# Patient Record
Sex: Female | Born: 1950 | Race: White | Hispanic: No | Marital: Married | Smoking: Never smoker
Health system: Southern US, Community
[De-identification: ages and names within clinical notes are randomized; demographics above are authoritative.]

## PROBLEM LIST (undated history)

## (undated) DIAGNOSIS — E785 Hyperlipidemia, unspecified: Secondary | ICD-10-CM

## (undated) DIAGNOSIS — I491 Atrial premature depolarization: Secondary | ICD-10-CM

## (undated) DIAGNOSIS — I499 Cardiac arrhythmia, unspecified: Secondary | ICD-10-CM

## (undated) DIAGNOSIS — Z0000 Encounter for general adult medical examination without abnormal findings: Secondary | ICD-10-CM

## (undated) DIAGNOSIS — K635 Polyp of colon: Secondary | ICD-10-CM

## (undated) DIAGNOSIS — M549 Dorsalgia, unspecified: Secondary | ICD-10-CM

## (undated) DIAGNOSIS — T7840XA Allergy, unspecified, initial encounter: Secondary | ICD-10-CM

## (undated) DIAGNOSIS — R002 Palpitations: Secondary | ICD-10-CM

## (undated) DIAGNOSIS — M1990 Unspecified osteoarthritis, unspecified site: Secondary | ICD-10-CM

## (undated) DIAGNOSIS — I471 Supraventricular tachycardia: Secondary | ICD-10-CM

## (undated) HISTORY — PX: PILONIDAL CYST EXCISION: SHX744

## (undated) HISTORY — DX: Dorsalgia, unspecified: M54.9

## (undated) HISTORY — PX: APPENDECTOMY: SHX54

## (undated) HISTORY — DX: Unspecified osteoarthritis, unspecified site: M19.90

## (undated) HISTORY — DX: Atrial premature depolarization: I49.1

## (undated) HISTORY — PX: OTHER SURGICAL HISTORY: SHX169

## (undated) HISTORY — DX: Polyp of colon: K63.5

## (undated) HISTORY — DX: Cardiac arrhythmia, unspecified: I49.9

## (undated) HISTORY — DX: Supraventricular tachycardia: I47.1

## (undated) HISTORY — DX: Allergy, unspecified, initial encounter: T78.40XA

## (undated) HISTORY — PX: TONSILLECTOMY: SHX5217

## (undated) HISTORY — DX: Palpitations: R00.2

## (undated) HISTORY — DX: Hyperlipidemia, unspecified: E78.5

## (undated) HISTORY — DX: Encounter for general adult medical examination without abnormal findings: Z00.00

---

## 1998-11-13 ENCOUNTER — Other Ambulatory Visit: Admission: RE | Admit: 1998-11-13 | Discharge: 1998-11-13 | Payer: Self-pay | Admitting: Family Medicine

## 1999-11-22 ENCOUNTER — Other Ambulatory Visit: Admission: RE | Admit: 1999-11-22 | Discharge: 1999-11-22 | Payer: Self-pay | Admitting: *Deleted

## 2001-04-20 ENCOUNTER — Ambulatory Visit (HOSPITAL_COMMUNITY): Admission: RE | Admit: 2001-04-20 | Discharge: 2001-04-20 | Payer: Self-pay | Admitting: Internal Medicine

## 2001-04-20 ENCOUNTER — Encounter (INDEPENDENT_AMBULATORY_CARE_PROVIDER_SITE_OTHER): Payer: Self-pay | Admitting: Specialist

## 2002-05-24 ENCOUNTER — Other Ambulatory Visit: Admission: RE | Admit: 2002-05-24 | Discharge: 2002-05-24 | Payer: Self-pay | Admitting: Internal Medicine

## 2003-06-02 ENCOUNTER — Other Ambulatory Visit: Admission: RE | Admit: 2003-06-02 | Discharge: 2003-06-02 | Payer: Self-pay | Admitting: Obstetrics & Gynecology

## 2003-11-06 ENCOUNTER — Ambulatory Visit (HOSPITAL_COMMUNITY): Admission: RE | Admit: 2003-11-06 | Discharge: 2003-11-06 | Payer: Self-pay | Admitting: Orthopedic Surgery

## 2004-07-01 ENCOUNTER — Other Ambulatory Visit: Admission: RE | Admit: 2004-07-01 | Discharge: 2004-07-01 | Payer: Self-pay | Admitting: Obstetrics & Gynecology

## 2005-07-25 ENCOUNTER — Other Ambulatory Visit: Admission: RE | Admit: 2005-07-25 | Discharge: 2005-07-25 | Payer: Self-pay | Admitting: Obstetrics & Gynecology

## 2007-10-13 ENCOUNTER — Ambulatory Visit: Payer: Self-pay | Admitting: Internal Medicine

## 2007-10-13 ENCOUNTER — Encounter: Payer: Self-pay | Admitting: Internal Medicine

## 2007-10-13 LAB — HM MAMMOGRAPHY

## 2007-10-18 ENCOUNTER — Encounter: Payer: Self-pay | Admitting: Internal Medicine

## 2007-11-04 ENCOUNTER — Encounter: Payer: Self-pay | Admitting: Internal Medicine

## 2007-11-04 ENCOUNTER — Ambulatory Visit: Payer: Self-pay | Admitting: Internal Medicine

## 2007-11-04 LAB — HM COLONOSCOPY

## 2007-11-08 ENCOUNTER — Encounter: Payer: Self-pay | Admitting: Internal Medicine

## 2010-11-08 NOTE — Procedures (Signed)
[facility]  Patient:    Robin Doyle, Robin Doyle Visit Number: [id number] MRN: [id number]          Service Type: END Location: ENDO Attending Physician:  Mervin Hack Dictated by:   Hart Carwin, M.D. Proc. Date: 04/20/01 Admit Date:  04/20/2001   CC:         Corwin Levins, M.D. [facility]   Procedure Report  PROCEDURE:  Colonoscopy.  INDICATIONS:  This 60 year old black female is undergoing screening colonoscopy.  She has a positive family history of colon cancer in her grandmother, who was diagnosed in her 100s.  The patient had hemoccults which were negative for occult blood. On physical exam on 04/06/01, her abdominal exam was unremarkable.  She is undergoing colonoscopy for neoplastic screening.  ENDOSCOPE: Olympus ______ scope.  SEDATION: Versed 70 mg IV, Demerol 70 mg. IV.  FINDINGS: The Olympus ______ endoscope was ______ through the rectum to the sigmoid colon. ______ were normal.  Her prep was excellent. The anal canal and rectal ampulla was normal.  There were no diverticula in the sigmoid colon. At the level of 60 cm from the rectum was a tiny, diminutive polyp, which flat and measured about 5-6 mm in diameter.  It was ablated with her biopsies, but no tissue was obtained.  The splenic flexure and transverse colon were normal. The ascending colon was normal.  In the cecum on the bottom of the pouch was also a small fusiform polyp, which was ablated with her biopsies, and tissue was sent to pathology.  There was no bleeding from postpolypectomy site.  The colonoscope was then retracted.  Her colon was decompressed.  The patient tolerated the procedure well.  IMPRESSION:  Two colon polyps, status post polypectomies.  PLAN: 1. Postpolypectomy instructions. 2. Repeat colonoscopy in five years, depending on the colon histology. Dictated by:   Hart Carwin, M.D. Attending Physician:  Mervin Hack DD:  04/20/01 TD:   04/21/01 Job: [id number]

## 2010-12-09 ENCOUNTER — Encounter: Payer: Self-pay | Admitting: Internal Medicine

## 2010-12-10 ENCOUNTER — Encounter: Payer: Self-pay | Admitting: Internal Medicine

## 2010-12-10 ENCOUNTER — Other Ambulatory Visit (INDEPENDENT_AMBULATORY_CARE_PROVIDER_SITE_OTHER): Payer: BC Managed Care – PPO

## 2010-12-10 ENCOUNTER — Ambulatory Visit (INDEPENDENT_AMBULATORY_CARE_PROVIDER_SITE_OTHER): Payer: BC Managed Care – PPO | Admitting: Internal Medicine

## 2010-12-10 ENCOUNTER — Other Ambulatory Visit: Payer: Self-pay | Admitting: Internal Medicine

## 2010-12-10 VITALS — BP 120/62 | HR 63 | Temp 97.9°F | Ht 67.5 in | Wt 133.0 lb

## 2010-12-10 DIAGNOSIS — E785 Hyperlipidemia, unspecified: Secondary | ICD-10-CM

## 2010-12-10 DIAGNOSIS — Z136 Encounter for screening for cardiovascular disorders: Secondary | ICD-10-CM

## 2010-12-10 DIAGNOSIS — Z0000 Encounter for general adult medical examination without abnormal findings: Secondary | ICD-10-CM

## 2010-12-10 DIAGNOSIS — R002 Palpitations: Secondary | ICD-10-CM

## 2010-12-10 LAB — URINALYSIS
Leukocytes, UA: NEGATIVE
Specific Gravity, Urine: <=1.005 (ref 1.000–1.030)
Total Protein, Urine: NEGATIVE
pH: 6.5 (ref 5.0–8.0)

## 2010-12-10 LAB — HEPATIC FUNCTION PANEL
ALT: 16 U/L (ref 0–35)
AST: 20 U/L (ref 0–37)
Albumin: 3.8 g/dL (ref 3.5–5.2)
Total Bilirubin: 0.5 mg/dL (ref 0.3–1.2)
Total Protein: 6.2 g/dL (ref 6.0–8.3)

## 2010-12-10 LAB — CBC WITH DIFFERENTIAL/PLATELET
Eosinophils Absolute: 0.2 10*3/uL (ref 0.0–0.7)
HCT: 40.5 % (ref 36.0–46.0)
Lymphs Abs: 1.1 10*3/uL (ref 0.7–4.0)
Monocytes Absolute: 0.4 10*3/uL (ref 0.1–1.0)
Monocytes Relative: 8 % (ref 3.0–12.0)
Neutrophils Relative %: 66.8 % (ref 43.0–77.0)
Platelets: 193 10*3/uL (ref 150.0–400.0)
RDW: 12.9 % (ref 11.5–14.6)

## 2010-12-10 LAB — BASIC METABOLIC PANEL
CO2: 28 mEq/L (ref 19–32)
Calcium: 9 mg/dL (ref 8.4–10.5)
Chloride: 108 mEq/L (ref 96–112)
Glucose, Bld: 77 mg/dL (ref 70–99)
Potassium: 3.9 mEq/L (ref 3.5–5.1)

## 2010-12-10 LAB — TSH: TSH: 3.23 u[IU]/mL (ref 0.35–5.50)

## 2010-12-10 MED ORDER — ESTRADIOL-NORETHINDRONE ACET 1-0.5 MG PO TABS: 1.0000 | ORAL_TABLET | Freq: Every day | ORAL | Status: DC
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
## 2010-12-11 ENCOUNTER — Encounter: Payer: Self-pay | Admitting: Internal Medicine

## 2010-12-11 DIAGNOSIS — E785 Hyperlipidemia, unspecified: Secondary | ICD-10-CM

## 2010-12-11 DIAGNOSIS — Z0000 Encounter for general adult medical examination without abnormal findings: Secondary | ICD-10-CM | POA: Insufficient documentation

## 2010-12-11 DIAGNOSIS — R002 Palpitations: Secondary | ICD-10-CM | POA: Insufficient documentation

## 2010-12-11 DIAGNOSIS — K635 Polyp of colon: Secondary | ICD-10-CM | POA: Insufficient documentation

## 2010-12-11 HISTORY — DX: Hyperlipidemia, unspecified: E78.5

## 2010-12-11 HISTORY — DX: Encounter for general adult medical examination without abnormal findings: Z00.00

## 2010-12-11 NOTE — Assessment & Plan Note (Signed)
Patient's lipid profile with an elevated LDL. Per NCEP ACP III guidelines her LDL goal is 130 or less. With a treatment threshold of 160+ she is right on the cusp. She has expressed a reluctance to take any medical therapy.  Plan - life-style management: low fat diet. She is already very physically active           Follow-up lipid panel in 6 months.

## 2010-12-11 NOTE — Assessment & Plan Note (Signed)
A generally healthy woman who has no active complaints. Her physical exam, sans pelvic, is normal. Labs  arein normal limits except for elevated total cholesterol and LDL (bad) cholesterol at 160. She is current with mammography. She is current with gyn exam and will be due a pelvic/PAP next year. She is current with colorectal cancer screening with last study '09. Immunizations - current with tetanus/Tdap - Jan '09. She is a candidate for shingles vaccine. 12 lead EKG without any evidence of ischemia.  In summary - a very nice woman who appears to be medically stable and healthy. She will return as needed or in 1 year.

## 2010-12-11 NOTE — Assessment & Plan Note (Signed)
Patient will have palpitations that are not sustained, most notable at night. She has no associated symptoms: no syncope, SOB/DOE, chest pain but will feel weak when this happens. Exam to day is normal and EKG with rhythm strip is normal except for possible RBBB.  Plan - observation - she is asked to check her pulse for rate and rhythm when she has symptoms           For frequent symptoms will set up for either 48 hour holter or event recorder.

## 2011-03-06 ENCOUNTER — Ambulatory Visit (INDEPENDENT_AMBULATORY_CARE_PROVIDER_SITE_OTHER): Payer: BC Managed Care – PPO

## 2011-03-06 DIAGNOSIS — Z23 Encounter for immunization: Secondary | ICD-10-CM

## 2011-04-24 ENCOUNTER — Ambulatory Visit (INDEPENDENT_AMBULATORY_CARE_PROVIDER_SITE_OTHER): Payer: BC Managed Care – PPO | Admitting: *Deleted

## 2011-04-24 DIAGNOSIS — Z23 Encounter for immunization: Secondary | ICD-10-CM

## 2011-04-24 MED ORDER — ZOSTER VACCINE LIVE 19400 UNT/0.65ML ~~LOC~~ SOLR
0.6500 mL | Freq: Once | SUBCUTANEOUS | Status: AC
  Administered 2011-04-24: 19400 [IU] via SUBCUTANEOUS

## 2011-04-28 ENCOUNTER — Ambulatory Visit: Payer: BC Managed Care – PPO | Admitting: Internal Medicine

## 2011-07-11 ENCOUNTER — Telehealth: Payer: Self-pay | Admitting: Internal Medicine

## 2011-08-05 ENCOUNTER — Other Ambulatory Visit: Payer: Self-pay

## 2011-08-05 MED ORDER — ESTRADIOL-NORETHINDRONE ACET 1-0.5 MG PO TABS: 1.0000 | ORAL_TABLET | Freq: Every day | ORAL | Status: DC

## 2011-09-08 ENCOUNTER — Encounter: Payer: Self-pay | Admitting: Internal Medicine

## 2011-09-24 ENCOUNTER — Telehealth: Payer: Self-pay | Admitting: *Deleted

## 2011-09-24 NOTE — Telephone Encounter (Signed)
Seen as a new pt June '12.  Cannot diagnose or prescribe by phone. May have OV this PM or tomorrow

## 2011-09-24 NOTE — Telephone Encounter (Signed)
Patient came in to office to get advice about her symptoms [she could not accept appointment, as her brother was up in GI getting colonoscopy and she was his ride home; and his caregiver for the day], so she would like to know what you recommend.  Pt reports that she has been having dry cough, H/A, post nasal drainage [did not know about color of sinus mucus], low-grade fever that increases at nighttime w/"prickly" feeling to skin, body aches x2 weeks. She states that she has "taken everything" when asked about OTC medications used. Please advise.

## 2011-09-25 ENCOUNTER — Encounter: Payer: Self-pay | Admitting: Internal Medicine

## 2011-09-25 ENCOUNTER — Ambulatory Visit: Payer: BC Managed Care – PPO

## 2011-09-25 ENCOUNTER — Ambulatory Visit (INDEPENDENT_AMBULATORY_CARE_PROVIDER_SITE_OTHER): Payer: BC Managed Care – PPO | Admitting: Internal Medicine

## 2011-09-25 ENCOUNTER — Other Ambulatory Visit: Payer: Self-pay | Admitting: Internal Medicine

## 2011-09-25 VITALS — BP 86/58 | HR 70 | Temp 97.0°F | Resp 16 | Wt 135.8 lb

## 2011-09-25 DIAGNOSIS — R509 Fever, unspecified: Secondary | ICD-10-CM

## 2011-09-25 LAB — CBC WITH DIFFERENTIAL/PLATELET
Basophils Absolute: 0 10*3/uL (ref 0.0–0.1)
Basophils Relative: 0.9 % (ref 0.0–3.0)
Hemoglobin: 14 g/dL (ref 12.0–15.0)
Lymphocytes Relative: 17.4 % (ref 12.0–46.0)
Lymphs Abs: 0.6 10*3/uL — ABNORMAL LOW (ref 0.7–4.0)
MCV: 90.1 fl (ref 78.0–100.0)
Monocytes Relative: 4.9 % (ref 3.0–12.0)
Neutro Abs: 2.6 10*3/uL (ref 1.4–7.7)
Neutrophils Relative %: 76.4 % (ref 43.0–77.0)
RBC: 4.63 Mil/uL (ref 3.87–5.11)
WBC: 3.3 10*3/uL — ABNORMAL LOW (ref 4.5–10.5)

## 2011-09-25 NOTE — Telephone Encounter (Signed)
Had OV 04.04.13/SLS

## 2011-09-25 NOTE — Patient Instructions (Signed)
Fevers with neck pain: exam is unrevealing with no sign of a bacterial infection.  This may be a viral infection, possibly viral meningitis vs influenza vs other.  Plan - complete blood count, blood cultures from two site           Tylenol 500 mg 1 or 2 every 6 hours on schedule until you have been fever free.                       For persistent or worsening fevers, increased neck pain or stiffness or for being light-headed or dizzy please call back.

## 2011-09-28 NOTE — Progress Notes (Signed)
Subjective:    Patient ID: Robin Doyle, female    DOB: [birth year], 61 y.o.   MRN: [id number]  HPI Mrs. Wach presents for a 1 week h/o nocturnal fevers to 102 along with some neck pain. She will have mild daytime fevers but less so. She has continued all her usual activities. She denies N/V/D, neck stiffness, cough, GU or GI symptoms, rash, SOB, chest pain. She has been taking APAP with some relief. She has had no out of country travel, no reported tick bites, no exposure to sick individuals. She admits to moderate myalgias/arthralgias. She is beyond a 72 hour window to consider Tamiflu for potential influenza. She generally enjoys good health.  Past Medical History  Diagnosis Date  . Arthritis     low back, posterior cervical, hips, shoulder  . Allergy   . Colon polyps     hyperplastic, last study  May '09  . Heart palpitations    Past Surgical History  Procedure Date  . Appendectomy '67  . Tonsillectomy '54  . Pilonidal cyst excision '70  . G2p2    Family History  Problem Relation Age of Onset  . Arthritis Mother   . Gout Mother   . Hypertension Mother   . Heart disease Mother     a. fib; pacemaker  . Heart disease Father     CAD/MI; Mitral valve disease, a. fib  . Gout Father   . Hyperlipidemia Father   . Arthritis Father     DDD, had surgrery with post-op pain.  . Mental retardation Brother     changed due to closed head injury 3 wheeler accident  . Cancer Maternal Aunt     breast cancer  . Cancer Paternal Grandmother     colon  . Diabetes Neg Hx   . Cancer Maternal Aunt     breast cancer   History   Social History  . Marital Status: Married    Spouse Name: N/A    Number of Children: 2  . Years of Education: 14   Occupational History  . cattle Passenger transport manager    Social History Main Topics  . Smoking status: Never Smoker   . Smokeless tobacco: Never Used  . Alcohol Use: No  . Drug Use: No  . Sexually Active: Yes -- Female partner(s)   Other Topics Concern    . Not on file   Social History Narrative   HSG, trade school - Clinical biochemist. Married '70. 2 sons - '73, '75; 1 adopted grandson, 1 grand-daughter. Work - keeps the farm and cattle. Marriage is in good health.        Review of Systems   System review is negative for any constitutional, cardiac, pulmonary, GI or neuro symptoms or complaints other than as described in the HPI.  Objective:   Physical Exam Filed Vitals:   09/25/11 1222  BP: 86/58  Pulse: 70  Temp: 97 F (36.1 C)  Resp: 16   Weight: 135 lb 12 oz (61.576 kg)   Gen'l - will nourished white woman in no acute distress. Has a tanned appearance of someone who works out of doors. HEENT- C&S clear, PERRLA, TM's normal, throat clear Nodes - negative submandibular, cervical, supraclavicular, axillary regions Neck - full active ROM Cor - 2+ radial pulses, RRR, no murmurs/rubs/gallops Chest - no deformity, no chest wall tenderness Pulm - normal respirations, normal exam w/o rales wheezes or rhonchi Neuro - non-focal exam.  Lab Results  Component Value Date   WBC 3.3*  09/25/2011   HGB 14.0 09/25/2011   HCT 41.7 09/25/2011   MCV 90.1 09/25/2011   PLT 96.0* 09/25/2011   Blood cultures - pending        Assessment & Plan:  Fever - no obvious source. Suspect viral illness, possibly flulike illness. No meningismus on exam and no neurologic symptoms  Plan - CBCD           Blood cultures x 2 sites           APAP as needed.            Close follow-up

## 2011-09-29 ENCOUNTER — Telehealth: Payer: Self-pay | Admitting: *Deleted

## 2011-09-29 ENCOUNTER — Ambulatory Visit (INDEPENDENT_AMBULATORY_CARE_PROVIDER_SITE_OTHER)
Admission: RE | Admit: 2011-09-29 | Discharge: 2011-09-29 | Disposition: A | Discharge status: Home / Self Care | Payer: BC Managed Care – PPO | Source: Ambulatory Visit | Attending: Internal Medicine | Admitting: Internal Medicine

## 2011-09-29 DIAGNOSIS — R05 Cough: Secondary | ICD-10-CM

## 2011-09-29 DIAGNOSIS — R509 Fever, unspecified: Secondary | ICD-10-CM

## 2011-09-29 DIAGNOSIS — R059 Cough, unspecified: Secondary | ICD-10-CM

## 2011-09-29 NOTE — Telephone Encounter (Signed)
Pt waiting on culture results; patient c/o cough with chest congestion that is not loose, fever is still low-grade [99], neck & hand joint pain is better. Requesting ABX.

## 2011-09-29 NOTE — Telephone Encounter (Signed)
Glad she called, she was on my mind. Blood cultures negative so far. With persistent fevers, now with cough, despite normal white blood count, she will need PA - lateral (2 view) chest x-ray. Order entered. Need this information in order to determine appropriate antibiotic.

## 2011-09-29 NOTE — Telephone Encounter (Signed)
Patient informed; will be in today for X-ray.

## 2012-01-29 ENCOUNTER — Other Ambulatory Visit: Payer: Self-pay | Admitting: Internal Medicine

## 2012-04-05 ENCOUNTER — Ambulatory Visit (INDEPENDENT_AMBULATORY_CARE_PROVIDER_SITE_OTHER): Payer: BC Managed Care – PPO

## 2012-04-05 DIAGNOSIS — Z23 Encounter for immunization: Secondary | ICD-10-CM

## 2012-05-19 ENCOUNTER — Ambulatory Visit (INDEPENDENT_AMBULATORY_CARE_PROVIDER_SITE_OTHER): Payer: BC Managed Care – PPO

## 2012-05-19 DIAGNOSIS — Z23 Encounter for immunization: Secondary | ICD-10-CM

## 2013-02-03 ENCOUNTER — Encounter: Payer: Self-pay | Admitting: Internal Medicine

## 2013-02-03 ENCOUNTER — Ambulatory Visit (INDEPENDENT_AMBULATORY_CARE_PROVIDER_SITE_OTHER): Payer: BC Managed Care – PPO | Admitting: Internal Medicine

## 2013-02-03 VITALS — BP 116/78 | HR 59 | Temp 97.3°F | Ht 67.0 in | Wt 133.8 lb

## 2013-02-03 DIAGNOSIS — R6889 Other general symptoms and signs: Secondary | ICD-10-CM

## 2013-02-03 DIAGNOSIS — E785 Hyperlipidemia, unspecified: Secondary | ICD-10-CM

## 2013-02-03 DIAGNOSIS — R899 Unspecified abnormal finding in specimens from other organs, systems and tissues: Secondary | ICD-10-CM

## 2013-02-03 NOTE — Progress Notes (Signed)
Subjective:     Patient ID: Robin Doyle, female   DOB: [birth year], 62 y.o.   MRN: [id number]  HPI Robin Doyle is a 62 year old lady here to discuss her thyroid.  Lab performed at Gyn office - she was advised that her thyroid was abnormal and she needed to see her PCP. 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   Past Medical History  Diagnosis Date   Arthritis     low back, posterior cervical, hips, shoulder   Allergy    Colon polyps     hyperplastic, last study  May '09   Heart palpitations    Past Surgical History  Procedure Laterality Date   Appendectomy  '67   Tonsillectomy  '54   Pilonidal cyst excision  '70   G2p2     Family History  Problem Relation Age of Onset   Arthritis Mother    Gout Mother    Hypertension Mother    Heart disease Mother     a. fib; pacemaker   Heart disease Father     CAD/MI; Mitral valve disease, a. fib   Gout Father    Hyperlipidemia Father    Arthritis Father     DDD, had surgrery with post-op pain.   Mental retardation Brother     changed due to closed head injury 3 wheeler accident   Cancer Maternal Aunt     breast cancer   Cancer Paternal Grandmother     colon   Diabetes Neg Hx    Cancer Maternal Aunt     breast cancer   History   Social History   Marital Status: Married    Spouse Name: N/A    Number of Children: 2   Years of Education: 14   Occupational History   cattle Passenger transport manager    Social History Main Topics   Smoking status: Never Smoker    Smokeless tobacco: Never Used   Alcohol Use: No   Drug Use: No   Sexual Activity: Yes    Partners: Male   Other Topics Concern   Not on file   Social History Narrative   HSG, trade school - Clinical biochemist. Married '70. 2 sons - '73, '75; 1 adopted grandson, 1 grand-daughter. Work - keeps the farm and cattle. Marriage is in  good health.     Current Outpatient Prescriptions on File Prior to Visit  Medication Sig Dispense Refill   Flaxseed, Linseed, (FLAXSEED OIL) 1200 MG CAPS Take by mouth.         MIMVEY 1-0.5 MG per tablet TAKE 1 TABLET BY MOUTH DAILY.  84 tablet  1   Omega-3 Fatty Acids (FISH OIL) 1200 MG CAPS Take by mouth.         psyllium (METAMUCIL) 0.52 G capsule Take 0.52 g by mouth daily. 2 daily       No current facility-administered medications on file prior to visit.     Review of Systems System review is negative for any constitutional, cardiac, pulmonary, GI or neuro symptoms or complaints other than as described in the HPI.     Objective:   Physical Exam Filed Vitals:   02/03/13 0937  BP: 116/78  Pulse: 59  Temp: 97.3 F (36.3 C)   Gen';l- WNWD lean woman in no distress HEENT- C&S clear, NO proptosis Neck - normal thyroid w/o nodules, w/o enlargement Cor- RRR Pulm -  normal respirations Neuro - A&O x 3.    Assessment:     1. Abnormal lab - TSH was 4.7 from outside lab, an insignificant elevation. She has no manifestations of thyroid disease. Plan reassurance     Plan:

## 2013-02-03 NOTE — Patient Instructions (Addendum)
Thanks for coming in.  Thyroid function is measured by the TSH (thyroid stimulating hormone). Your lab = 4.717, normal 0.35-4.5. NO PROBLEM  Cholesterol - the good cholesterol is 50 (nl 40+), the bad cholesterol, the LDL, is too high at 160 (goal is 130 or less). Your risk of a cardiac event in the next 10 years is calculated to be 2% - low risk. Best treatment is continued level of exercise and diet - low fat, moderation in all things.   Overworked and fatigue will lead to brain fog.

## 2013-02-08 NOTE — Assessment & Plan Note (Signed)
Reviewed lab with elevated LDL but excellent HDL. 10 year cardiac risk calculation with 2% risk of an event.  Plan Life-style management only.

## 2013-03-30 ENCOUNTER — Ambulatory Visit (INDEPENDENT_AMBULATORY_CARE_PROVIDER_SITE_OTHER): Payer: BC Managed Care – PPO

## 2013-03-30 DIAGNOSIS — Z23 Encounter for immunization: Secondary | ICD-10-CM

## 2013-04-28 ENCOUNTER — Telehealth: Payer: Self-pay | Admitting: Internal Medicine

## 2013-04-28 ENCOUNTER — Encounter: Payer: Self-pay | Admitting: Internal Medicine

## 2013-04-28 NOTE — Telephone Encounter (Signed)
Per Karen Kitchens the patient has transferred care to Dr. Kinnie Scales.

## 2013-04-28 NOTE — Progress Notes (Signed)
Patient has changed GI care to Dr Kinnie Scales as of 04/27/13.

## 2013-08-01 ENCOUNTER — Encounter: Payer: Self-pay | Admitting: Internal Medicine

## 2013-09-26 NOTE — Telephone Encounter (Signed)
Error

## 2013-09-29 IMAGING — CR DG CHEST 2V
2 series · 2 of 2 positions shown · non-contrast
Comparison: None

CLINICAL DATA: Fever, cough.

CHEST - 2 VIEW

[view not recorded (1 of 2)]
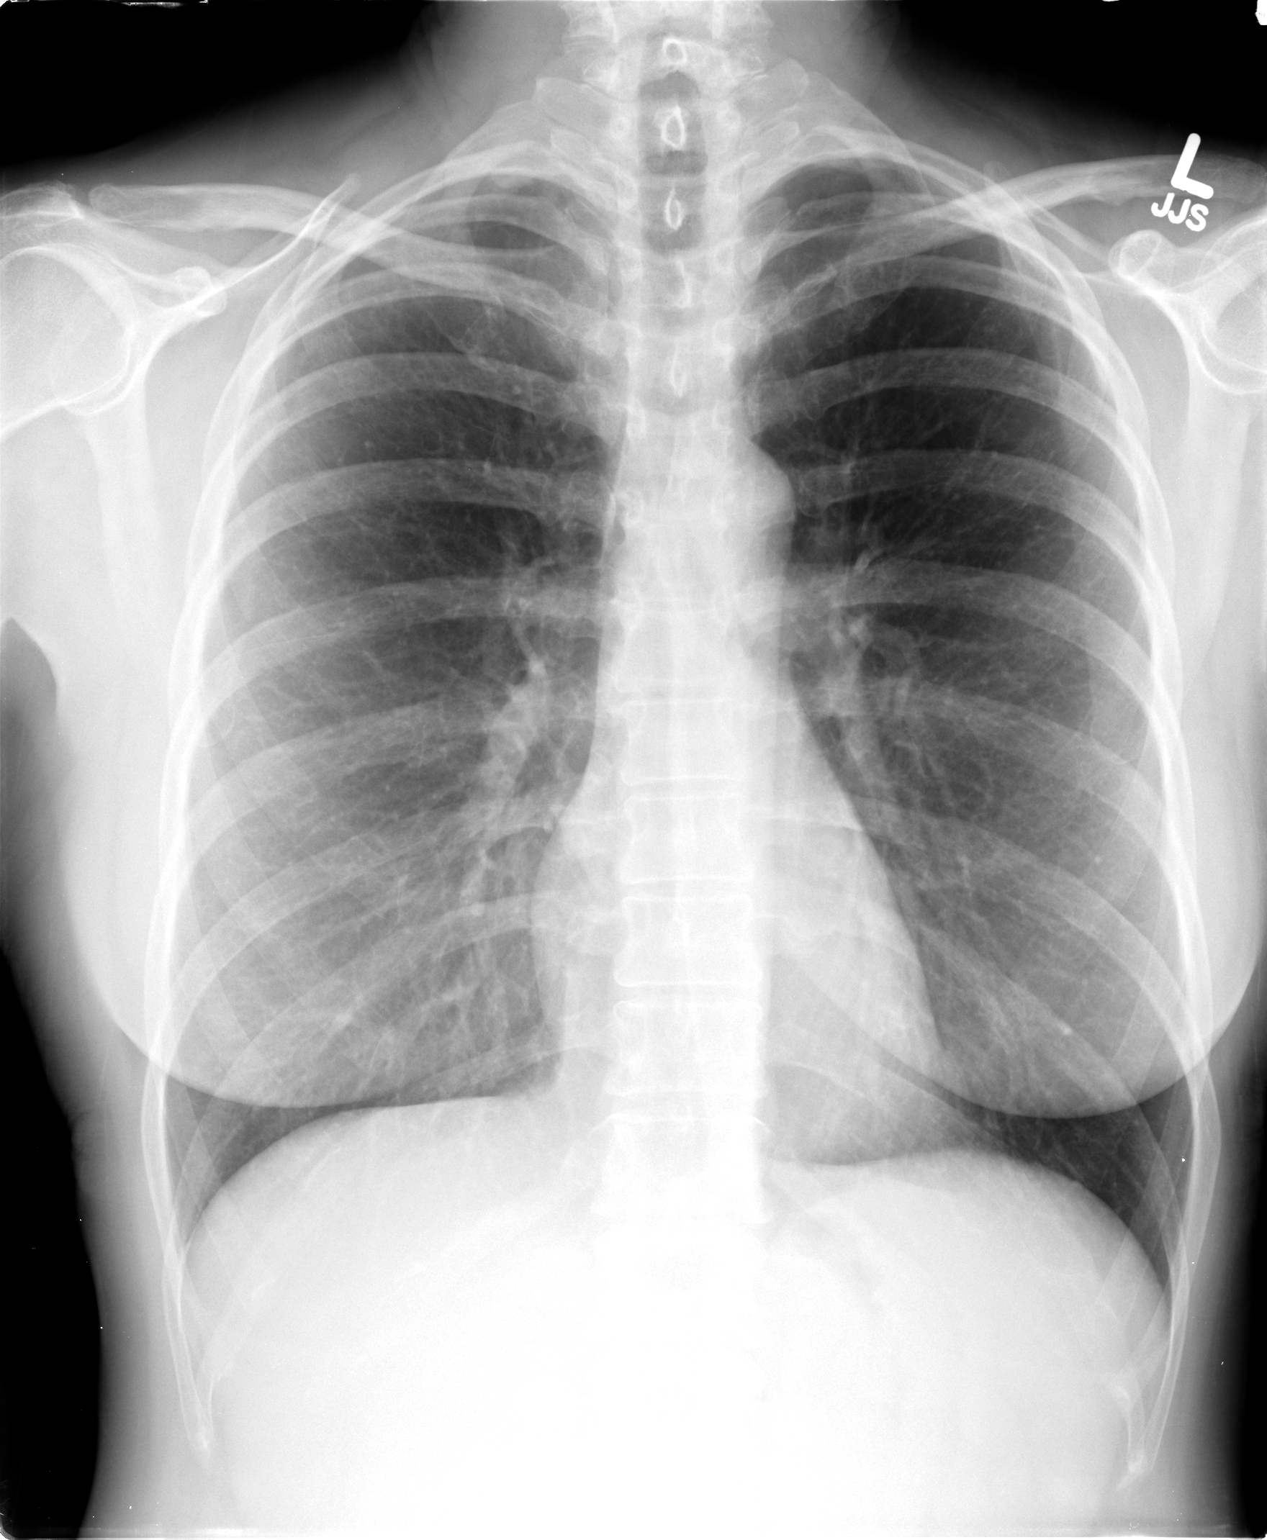

[view not recorded (2 of 2)]
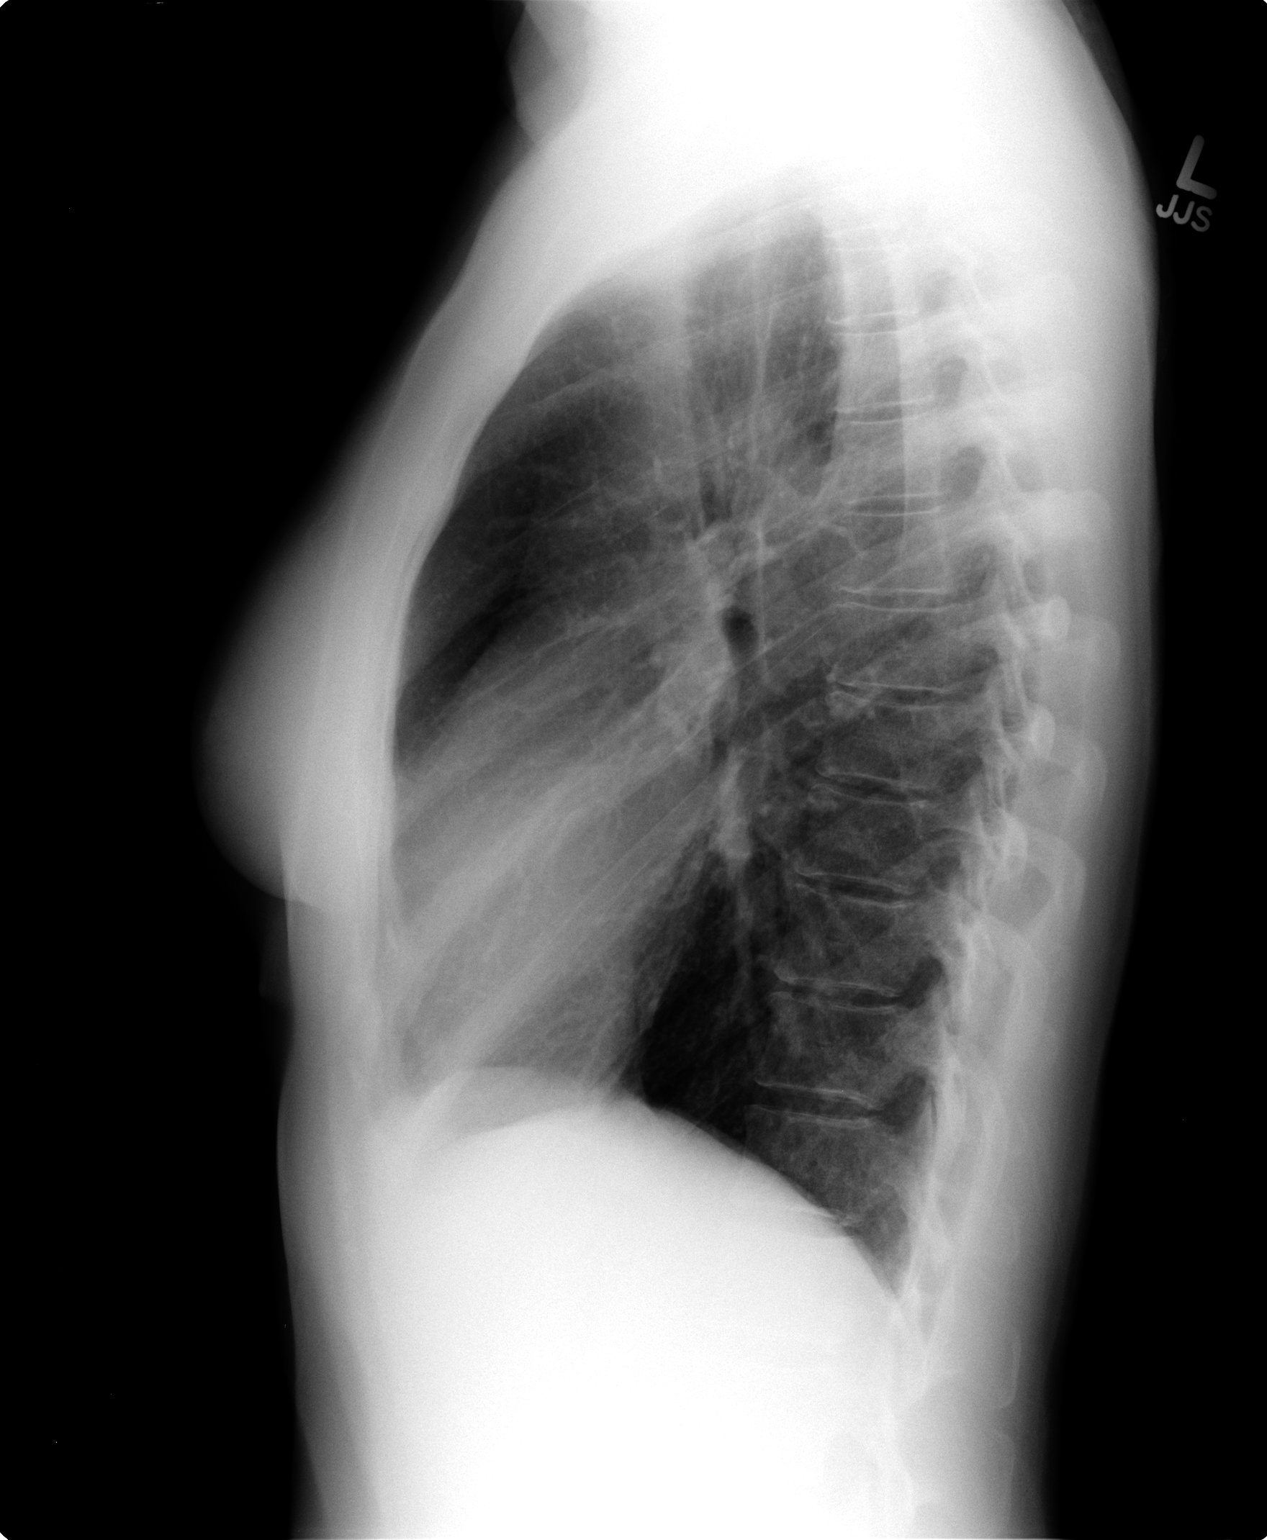

[2 of 2 positions shown; findings below may reference images not displayed]

FINDINGS: Heart and mediastinal contours are within normal limits.
No focal opacities or effusions.  No acute bony abnormality.
IMPRESSION: No active cardiopulmonary disease.

## 2014-01-25 ENCOUNTER — Other Ambulatory Visit: Payer: Self-pay | Admitting: Obstetrics & Gynecology

## 2014-01-26 LAB — CYTOLOGY - PAP

## 2014-02-23 ENCOUNTER — Encounter: Payer: Self-pay | Admitting: Internal Medicine

## 2015-02-06 ENCOUNTER — Other Ambulatory Visit: Payer: Self-pay | Admitting: Obstetrics & Gynecology

## 2015-02-06 DIAGNOSIS — Z1321 Encounter for screening for nutritional disorder: Secondary | ICD-10-CM | POA: Diagnosis not present

## 2015-02-06 DIAGNOSIS — Z6821 Body mass index (BMI) 21.0-21.9, adult: Secondary | ICD-10-CM | POA: Diagnosis not present

## 2015-02-06 DIAGNOSIS — E039 Hypothyroidism, unspecified: Secondary | ICD-10-CM | POA: Diagnosis not present

## 2015-02-06 DIAGNOSIS — Z01419 Encounter for gynecological examination (general) (routine) without abnormal findings: Secondary | ICD-10-CM | POA: Diagnosis not present

## 2015-02-06 DIAGNOSIS — Z13228 Encounter for screening for other metabolic disorders: Secondary | ICD-10-CM | POA: Diagnosis not present

## 2015-02-06 DIAGNOSIS — Z13220 Encounter for screening for lipoid disorders: Secondary | ICD-10-CM | POA: Diagnosis not present

## 2015-02-06 DIAGNOSIS — Z124 Encounter for screening for malignant neoplasm of cervix: Secondary | ICD-10-CM | POA: Diagnosis not present

## 2015-02-06 DIAGNOSIS — Z1329 Encounter for screening for other suspected endocrine disorder: Secondary | ICD-10-CM | POA: Diagnosis not present

## 2015-02-07 LAB — CYTOLOGY - PAP

## 2015-02-19 DIAGNOSIS — L719 Rosacea, unspecified: Secondary | ICD-10-CM | POA: Diagnosis not present

## 2015-02-19 DIAGNOSIS — H10413 Chronic giant papillary conjunctivitis, bilateral: Secondary | ICD-10-CM | POA: Diagnosis not present

## 2015-03-12 DIAGNOSIS — L821 Other seborrheic keratosis: Secondary | ICD-10-CM | POA: Diagnosis not present

## 2015-03-12 DIAGNOSIS — D485 Neoplasm of uncertain behavior of skin: Secondary | ICD-10-CM | POA: Diagnosis not present

## 2015-03-12 DIAGNOSIS — D2312 Other benign neoplasm of skin of left eyelid, including canthus: Secondary | ICD-10-CM | POA: Diagnosis not present

## 2015-09-17 DIAGNOSIS — J0100 Acute maxillary sinusitis, unspecified: Secondary | ICD-10-CM | POA: Diagnosis not present

## 2015-10-10 DIAGNOSIS — R87619 Unspecified abnormal cytological findings in specimens from cervix uteri: Secondary | ICD-10-CM | POA: Diagnosis not present

## 2015-10-10 DIAGNOSIS — N390 Urinary tract infection, site not specified: Secondary | ICD-10-CM | POA: Diagnosis not present

## 2015-10-10 DIAGNOSIS — N950 Postmenopausal bleeding: Secondary | ICD-10-CM | POA: Diagnosis not present

## 2015-10-24 ENCOUNTER — Other Ambulatory Visit: Payer: Self-pay | Admitting: Obstetrics & Gynecology

## 2015-10-24 DIAGNOSIS — N950 Postmenopausal bleeding: Secondary | ICD-10-CM | POA: Diagnosis not present

## 2015-10-24 DIAGNOSIS — N840 Polyp of corpus uteri: Secondary | ICD-10-CM | POA: Diagnosis not present

## 2015-11-01 DIAGNOSIS — Z1329 Encounter for screening for other suspected endocrine disorder: Secondary | ICD-10-CM | POA: Diagnosis not present

## 2015-11-01 DIAGNOSIS — R928 Other abnormal and inconclusive findings on diagnostic imaging of breast: Secondary | ICD-10-CM | POA: Diagnosis not present

## 2015-11-01 DIAGNOSIS — Z803 Family history of malignant neoplasm of breast: Secondary | ICD-10-CM | POA: Diagnosis not present

## 2015-11-01 DIAGNOSIS — R922 Inconclusive mammogram: Secondary | ICD-10-CM | POA: Diagnosis not present

## 2015-11-01 DIAGNOSIS — Z1231 Encounter for screening mammogram for malignant neoplasm of breast: Secondary | ICD-10-CM | POA: Diagnosis not present

## 2015-11-10 DIAGNOSIS — A932 Colorado tick fever: Secondary | ICD-10-CM | POA: Diagnosis not present

## 2015-11-11 DIAGNOSIS — A848 Other tick-borne viral encephalitis: Secondary | ICD-10-CM | POA: Diagnosis not present

## 2016-02-07 DIAGNOSIS — Z9181 History of falling: Secondary | ICD-10-CM | POA: Diagnosis not present

## 2016-02-07 DIAGNOSIS — I491 Atrial premature depolarization: Secondary | ICD-10-CM | POA: Diagnosis not present

## 2016-02-07 DIAGNOSIS — R002 Palpitations: Secondary | ICD-10-CM | POA: Diagnosis not present

## 2016-02-07 DIAGNOSIS — Z1389 Encounter for screening for other disorder: Secondary | ICD-10-CM | POA: Diagnosis not present

## 2016-02-07 DIAGNOSIS — Z6821 Body mass index (BMI) 21.0-21.9, adult: Secondary | ICD-10-CM | POA: Diagnosis not present

## 2016-03-20 DIAGNOSIS — Z6821 Body mass index (BMI) 21.0-21.9, adult: Secondary | ICD-10-CM | POA: Diagnosis not present

## 2016-03-20 DIAGNOSIS — R002 Palpitations: Secondary | ICD-10-CM | POA: Diagnosis not present

## 2016-03-21 DIAGNOSIS — R002 Palpitations: Secondary | ICD-10-CM | POA: Diagnosis not present

## 2016-04-01 DIAGNOSIS — R002 Palpitations: Secondary | ICD-10-CM | POA: Diagnosis not present

## 2016-04-03 DIAGNOSIS — R002 Palpitations: Secondary | ICD-10-CM | POA: Diagnosis not present

## 2016-04-29 DIAGNOSIS — H43393 Other vitreous opacities, bilateral: Secondary | ICD-10-CM | POA: Diagnosis not present

## 2016-04-29 DIAGNOSIS — H2513 Age-related nuclear cataract, bilateral: Secondary | ICD-10-CM | POA: Diagnosis not present

## 2016-05-05 DIAGNOSIS — M25511 Pain in right shoulder: Secondary | ICD-10-CM | POA: Diagnosis not present

## 2016-05-05 DIAGNOSIS — Z6821 Body mass index (BMI) 21.0-21.9, adult: Secondary | ICD-10-CM | POA: Diagnosis not present

## 2016-05-05 DIAGNOSIS — I4892 Unspecified atrial flutter: Secondary | ICD-10-CM | POA: Diagnosis not present

## 2016-05-05 DIAGNOSIS — M25512 Pain in left shoulder: Secondary | ICD-10-CM | POA: Diagnosis not present

## 2016-05-29 DIAGNOSIS — I4892 Unspecified atrial flutter: Secondary | ICD-10-CM | POA: Diagnosis not present

## 2016-06-24 DIAGNOSIS — I471 Supraventricular tachycardia: Secondary | ICD-10-CM

## 2016-06-24 DIAGNOSIS — I491 Atrial premature depolarization: Secondary | ICD-10-CM

## 2016-06-24 DIAGNOSIS — I4719 Other supraventricular tachycardia: Secondary | ICD-10-CM

## 2016-06-24 HISTORY — DX: Supraventricular tachycardia: I47.1

## 2016-06-24 HISTORY — DX: Atrial premature depolarization: I49.1

## 2016-06-24 HISTORY — DX: Other supraventricular tachycardia: I47.19

## 2016-06-25 DIAGNOSIS — I4892 Unspecified atrial flutter: Secondary | ICD-10-CM | POA: Diagnosis not present

## 2016-06-25 DIAGNOSIS — I471 Supraventricular tachycardia: Secondary | ICD-10-CM | POA: Diagnosis not present

## 2016-06-25 DIAGNOSIS — Z6821 Body mass index (BMI) 21.0-21.9, adult: Secondary | ICD-10-CM | POA: Diagnosis not present

## 2016-06-25 DIAGNOSIS — I491 Atrial premature depolarization: Secondary | ICD-10-CM | POA: Diagnosis not present

## 2016-07-24 DIAGNOSIS — N762 Acute vulvitis: Secondary | ICD-10-CM | POA: Diagnosis not present

## 2016-07-24 DIAGNOSIS — L570 Actinic keratosis: Secondary | ICD-10-CM | POA: Diagnosis not present

## 2016-07-24 DIAGNOSIS — L219 Seborrheic dermatitis, unspecified: Secondary | ICD-10-CM | POA: Diagnosis not present

## 2016-07-30 DIAGNOSIS — Z6821 Body mass index (BMI) 21.0-21.9, adult: Secondary | ICD-10-CM | POA: Diagnosis not present

## 2016-07-30 DIAGNOSIS — I491 Atrial premature depolarization: Secondary | ICD-10-CM | POA: Diagnosis not present

## 2016-07-30 DIAGNOSIS — I471 Supraventricular tachycardia: Secondary | ICD-10-CM | POA: Diagnosis not present

## 2016-11-26 DIAGNOSIS — Z6821 Body mass index (BMI) 21.0-21.9, adult: Secondary | ICD-10-CM | POA: Diagnosis not present

## 2016-11-26 DIAGNOSIS — I491 Atrial premature depolarization: Secondary | ICD-10-CM | POA: Diagnosis not present

## 2016-11-26 DIAGNOSIS — I471 Supraventricular tachycardia: Secondary | ICD-10-CM | POA: Diagnosis not present

## 2017-01-21 DIAGNOSIS — B001 Herpesviral vesicular dermatitis: Secondary | ICD-10-CM | POA: Diagnosis not present

## 2017-01-21 DIAGNOSIS — Z6821 Body mass index (BMI) 21.0-21.9, adult: Secondary | ICD-10-CM | POA: Diagnosis not present

## 2017-01-21 DIAGNOSIS — T7840XA Allergy, unspecified, initial encounter: Secondary | ICD-10-CM | POA: Diagnosis not present

## 2017-02-05 DIAGNOSIS — I499 Cardiac arrhythmia, unspecified: Secondary | ICD-10-CM | POA: Insufficient documentation

## 2017-02-05 DIAGNOSIS — M549 Dorsalgia, unspecified: Secondary | ICD-10-CM | POA: Insufficient documentation

## 2017-02-05 HISTORY — DX: Cardiac arrhythmia, unspecified: I49.9

## 2017-02-05 HISTORY — DX: Dorsalgia, unspecified: M54.9

## 2017-02-08 DIAGNOSIS — Z79899 Other long term (current) drug therapy: Secondary | ICD-10-CM | POA: Insufficient documentation

## 2017-02-08 HISTORY — DX: Other long term (current) drug therapy: Z79.899

## 2017-02-08 NOTE — Progress Notes (Signed)
Cardiology Office Note:    Date:  02/09/2017   ID:  Robin Doyle, DOB Oct 24, 1950, MRN [id number]  PCP:  Neena Rhymes, MD  Cardiologist:  Shirlee More, MD    Referring MD: Neena Rhymes, MD    ASSESSMENT:    1. Ectopic atrial tachycardia ([place])   2. High risk medication use    PLAN:    In order of problems listed above:  1. Stable continue low-dose flecainide. She is not on a beta or calcium channel blocker with previous symptomatic hypotension. 2. Stable no evidence of toxicity check BMP flecainide level.   Next appointment: 1 year   Medication Adjustments/Labs and Tests Ordered: Current medicines are reviewed at length with the patient today.  Concerns regarding medicines are outlined above.  Orders Placed This Encounter  Procedures  . Flecainide level  . Basic Metabolic Panel (BMET)  . EKG 12-Lead   No orders of the defined types were placed in this encounter.   Chief Complaint  Patient presents with  . Follow-up    routine flup appt   . Blurred Vision    History of Present Illness:    Robin Doyle is a 66 y.o. female with a hx of paroxysmal palpitation with PAT suppressed with flecanide last seen in September 2017.She continues to do well now recurrent tachyarrhythmia takes a minimum dose of flecainide and elects to continue current medical treatment. She has some vague visual blurring will check a flecainide level I doubt toxicity and I asked her to be seen by ophthalmology. She's had no palpitation chest pain palpitation or syncope. Compliance with diet, lifestyle and medications: yes  Past Medical History:  Diagnosis Date  . Allergy   . Arrhythmia 02/05/2017  . Arthritis    low back, posterior cervical, hips, shoulder  . Back pain 02/05/2017  . Colon polyps    hyperplastic, last study  May '09  . Ectopic atrial tachycardia ([place]) 06/24/2016  . Heart palpitations   . Hyperlipidemia LDL goal < 130 12/11/2010   Baseline LDL 160 June '12   .  Premature atrial contractions 06/24/2016  . Routine general medical examination at a health care facility 12/11/2010    Past Surgical History:  Procedure Laterality Date  . APPENDECTOMY  '67  . G2P2    . PILONIDAL CYST EXCISION  '70  . TONSILLECTOMY  '54    Current Medications: Current Meds  Medication Sig  . Biotin 1000 MCG tablet Take 1,000 mcg by mouth daily.  . Cholecalciferol (VITAMIN D3) 400 units CAPS Take 400 Units by mouth every other day.  . Cyanocobalamin (VITAMIN B 12 PO) Take 500 mcg by mouth daily.  . Flaxseed, Linseed, (FLAXSEED OIL) 1200 MG CAPS Take 600 mg by mouth daily.   . flecainide (TAMBOCOR) 50 MG tablet Take 25 mg by mouth 2 (two) times daily.  . Lactobacillus (ACIDOPHILUS PO) Take 1 tablet by mouth daily.  . Multiple Vitamins-Minerals (ALIVE WOMENS 50+ PO) Take 1 tablet by mouth daily.  . Omega-3 Fatty Acids (FISH OIL) 1200 MG CAPS Take 360 mg by mouth daily.   . psyllium (METAMUCIL) 0.52 G capsule Take 0.52 g by mouth daily. 2 daily  . [DISCONTINUED] MIMVEY 1-0.5 MG per tablet TAKE 1 TABLET BY MOUTH DAILY.     Allergies:   Codeine; Lidocaine; and Prednisone   Social History   Social History  . Marital status: Married    Spouse name: N/A  . Number of children: 2  . Years of education:  14   Occupational History  . cattle Dance movement psychotherapist    Social History Main Topics  . Smoking status: Never Smoker  . Smokeless tobacco: Never Used  . Alcohol use No  . Drug use: No  . Sexual activity: Yes    Partners: Male   Other Topics Concern  . None   Social History Narrative   HSG, trade school - Investment banker, corporate. Married '70. 2 sons - '73, '75; 1 adopted grandson, 1 grand-daughter. Work - keeps the farm and cattle. Marriage is in good health.      Family History: The patient's family history includes Arrhythmia in her father; Arthritis in her father and mother; Atrial fibrillation in her mother; Cancer in her maternal aunt, maternal aunt, and paternal  grandmother; Gout in her father and mother; Heart disease in her father and mother; Heart failure in her mother; Hyperlipidemia in her father; Hypertension in her mother; Mental retardation in her brother. There is no history of Diabetes. ROS:   Please see the history of present illness.    All other systems reviewed and are negative.  EKGs/Labs/Other Studies Reviewed:    The following studies were reviewed today:  EKG:  EKG ordered today.  The ekg ordered today demonstrates Sinus rhythm normal  Recent Labs: No results found for requested labs within last 8760 hours.  Recent Lipid Panel    Component Value Date/Time   CHOL 218 (H) 12/10/2010 0802   TRIG 51.0 12/10/2010 0802   HDL 44.80 12/10/2010 0802   CHOLHDL 5 12/10/2010 0802   VLDL 10.2 12/10/2010 0802   LDLDIRECT 160.1 12/10/2010 0802    Physical Exam:    VS:  BP 122/76 (BP Location: Right Arm, Patient Position: Sitting)   Pulse (!) 57   Ht 5\' 7"  (1.702 m)   Wt 129 lb 12.8 oz (58.9 kg)   SpO2 99%   BMI 20.33 kg/m     Wt Readings from Last 3 Encounters:  02/09/17 129 lb 12.8 oz (58.9 kg)  02/03/13 133 lb 12.8 oz (60.7 kg)  09/25/11 135 lb 12 oz (61.6 kg)     GEN:  Well nourished, well developed in no acute distress HEENT: Normal NECK: No JVD; No carotid bruits LYMPHATICS: No lymphadenopathy CARDIAC: RRR, no murmurs, rubs, gallops RESPIRATORY:  Clear to auscultation without rales, wheezing or rhonchi  ABDOMEN: Soft, non-tender, non-distended MUSCULOSKELETAL:  No edema; No deformity  SKIN: Warm and dry NEUROLOGIC:  Alert and oriented x 3 PSYCHIATRIC:  Normal affect    Signed, Shirlee More, MD  02/09/2017 11:58 AM    [place]

## 2017-02-09 ENCOUNTER — Ambulatory Visit (INDEPENDENT_AMBULATORY_CARE_PROVIDER_SITE_OTHER): Payer: Medicare Other | Admitting: Cardiology

## 2017-02-09 ENCOUNTER — Encounter: Payer: Self-pay | Admitting: Cardiology

## 2017-02-09 VITALS — BP 122/76 | HR 57 | Ht 67.0 in | Wt 129.8 lb

## 2017-02-09 DIAGNOSIS — Z79899 Other long term (current) drug therapy: Secondary | ICD-10-CM | POA: Diagnosis not present

## 2017-02-09 DIAGNOSIS — I471 Supraventricular tachycardia: Secondary | ICD-10-CM

## 2017-02-09 NOTE — Patient Instructions (Addendum)
Medication Instructions:  Your physician recommends that you continue on your current medications as directed. Please refer to the Current Medication list given to you today.   Labwork: Your physician recommends that you return for lab work in: today. BMP. Flecainide level.   Testing/Procedures: You had an EKG today.  Follow-Up: Your physician wants you to follow-up in: 1 year. You will receive a reminder letter in the mail two months in advance. If you don't receive a letter, please call our office to schedule the follow-up appointment.   Any Other Special Instructions Will Be Listed Below (If Applicable).     If you need a refill on your cardiac medications before your next appointment, please call your pharmacy.    1. Avoid all over-the-counter antihistamines except Claritin/Loratadine and Zyrtec/Cetrizine. 2. Avoid all combination including cold sinus allergies flu decongestant and sleep medications 3. You can use Robitussin DM Mucinex and Mucinex DM for cough. 4. can use Tylenol aspirin ibuprofen and naproxen but no combinations such as sleep or sinus.

## 2017-02-10 ENCOUNTER — Telehealth: Payer: Self-pay | Admitting: Cardiology

## 2017-02-10 NOTE — Telephone Encounter (Signed)
Returned your call.

## 2017-02-10 NOTE — Telephone Encounter (Signed)
Patient informed of results.  

## 2017-02-11 LAB — BASIC METABOLIC PANEL
BUN/Creatinine Ratio: 18 (ref 12–28)
BUN: 18 mg/dL (ref 8–27)
CALCIUM: 10.2 mg/dL (ref 8.7–10.3)
CHLORIDE: 104 mmol/L (ref 96–106)
CO2: 26 mmol/L (ref 20–29)
Creatinine, Ser: 0.99 mg/dL (ref 0.57–1.00)
GFR calc non Af Amer: 59 mL/min/{1.73_m2} — ABNORMAL LOW (ref >59)
GFR, EST AFRICAN AMERICAN: 68 mL/min/{1.73_m2} (ref >59)
Glucose: 87 mg/dL (ref 65–99)
POTASSIUM: 4.1 mmol/L (ref 3.5–5.2)
Sodium: 143 mmol/L (ref 134–144)

## 2017-02-11 LAB — FLECAINIDE LEVEL: FLECAINIDE: 0.12 ug/mL — AB (ref 0.20–1.00)

## 2017-02-16 DIAGNOSIS — H10413 Chronic giant papillary conjunctivitis, bilateral: Secondary | ICD-10-CM | POA: Diagnosis not present

## 2017-05-04 DIAGNOSIS — Z124 Encounter for screening for malignant neoplasm of cervix: Secondary | ICD-10-CM | POA: Diagnosis not present

## 2017-05-04 DIAGNOSIS — R102 Pelvic and perineal pain: Secondary | ICD-10-CM | POA: Diagnosis not present

## 2017-05-04 DIAGNOSIS — Z6821 Body mass index (BMI) 21.0-21.9, adult: Secondary | ICD-10-CM | POA: Diagnosis not present

## 2017-05-08 DIAGNOSIS — Z6821 Body mass index (BMI) 21.0-21.9, adult: Secondary | ICD-10-CM | POA: Diagnosis not present

## 2017-05-08 DIAGNOSIS — S2242XA Multiple fractures of ribs, left side, initial encounter for closed fracture: Secondary | ICD-10-CM | POA: Diagnosis not present

## 2017-05-08 DIAGNOSIS — R0781 Pleurodynia: Secondary | ICD-10-CM | POA: Diagnosis not present

## 2017-05-26 DIAGNOSIS — Z9181 History of falling: Secondary | ICD-10-CM | POA: Diagnosis not present

## 2017-05-26 DIAGNOSIS — Z136 Encounter for screening for cardiovascular disorders: Secondary | ICD-10-CM | POA: Diagnosis not present

## 2017-05-26 DIAGNOSIS — Z1331 Encounter for screening for depression: Secondary | ICD-10-CM | POA: Diagnosis not present

## 2017-05-26 DIAGNOSIS — E785 Hyperlipidemia, unspecified: Secondary | ICD-10-CM | POA: Diagnosis not present

## 2017-05-26 DIAGNOSIS — Z0000 Encounter for general adult medical examination without abnormal findings: Secondary | ICD-10-CM | POA: Diagnosis not present

## 2017-10-19 DIAGNOSIS — M7701 Medial epicondylitis, right elbow: Secondary | ICD-10-CM | POA: Diagnosis not present

## 2017-10-19 DIAGNOSIS — Z6821 Body mass index (BMI) 21.0-21.9, adult: Secondary | ICD-10-CM | POA: Diagnosis not present

## 2017-10-21 DIAGNOSIS — M7701 Medial epicondylitis, right elbow: Secondary | ICD-10-CM | POA: Diagnosis not present

## 2017-12-09 ENCOUNTER — Telehealth: Payer: Self-pay | Admitting: Cardiology

## 2017-12-09 MED ORDER — FLECAINIDE ACETATE 50 MG PO TABS: 25.0000 mg | ORAL_TABLET | Freq: Two times a day (BID) | ORAL | 1 refills | Status: DC

## 2017-12-09 NOTE — Telephone Encounter (Signed)
Refill sent.

## 2017-12-09 NOTE — Telephone Encounter (Signed)
Please send refill of flecainide 50mg  to [facility] in [place] in [address]

## 2018-01-01 ENCOUNTER — Telehealth: Payer: Self-pay | Admitting: Cardiology

## 2018-01-01 DIAGNOSIS — R220 Localized swelling, mass and lump, head: Secondary | ICD-10-CM | POA: Diagnosis not present

## 2018-01-01 DIAGNOSIS — Z6821 Body mass index (BMI) 21.0-21.9, adult: Secondary | ICD-10-CM | POA: Diagnosis not present

## 2018-01-01 DIAGNOSIS — T783XXA Angioneurotic edema, initial encounter: Secondary | ICD-10-CM | POA: Diagnosis not present

## 2018-01-04 ENCOUNTER — Other Ambulatory Visit: Payer: Self-pay

## 2018-01-04 MED ORDER — FLECAINIDE ACETATE 50 MG PO TABS: 25.0000 mg | ORAL_TABLET | Freq: Two times a day (BID) | ORAL | 1 refills | Status: DC

## 2018-01-13 DIAGNOSIS — S6992XA Unspecified injury of left wrist, hand and finger(s), initial encounter: Secondary | ICD-10-CM | POA: Diagnosis not present

## 2018-01-13 DIAGNOSIS — M79641 Pain in right hand: Secondary | ICD-10-CM | POA: Diagnosis not present

## 2018-01-13 DIAGNOSIS — M25532 Pain in left wrist: Secondary | ICD-10-CM | POA: Diagnosis not present

## 2018-01-13 DIAGNOSIS — M25572 Pain in left ankle and joints of left foot: Secondary | ICD-10-CM | POA: Diagnosis not present

## 2018-01-13 DIAGNOSIS — M79672 Pain in left foot: Secondary | ICD-10-CM | POA: Diagnosis not present

## 2018-01-13 DIAGNOSIS — S99912A Unspecified injury of left ankle, initial encounter: Secondary | ICD-10-CM | POA: Diagnosis not present

## 2018-01-13 DIAGNOSIS — S6991XA Unspecified injury of right wrist, hand and finger(s), initial encounter: Secondary | ICD-10-CM | POA: Diagnosis not present

## 2018-01-14 NOTE — Telephone Encounter (Signed)
Error

## 2018-01-25 DIAGNOSIS — M25532 Pain in left wrist: Secondary | ICD-10-CM | POA: Diagnosis not present

## 2018-01-27 DIAGNOSIS — L659 Nonscarring hair loss, unspecified: Secondary | ICD-10-CM | POA: Diagnosis not present

## 2018-01-27 DIAGNOSIS — R300 Dysuria: Secondary | ICD-10-CM | POA: Diagnosis not present

## 2018-01-27 DIAGNOSIS — N760 Acute vaginitis: Secondary | ICD-10-CM | POA: Diagnosis not present

## 2018-01-27 DIAGNOSIS — R5383 Other fatigue: Secondary | ICD-10-CM | POA: Diagnosis not present

## 2018-02-12 ENCOUNTER — Ambulatory Visit (INDEPENDENT_AMBULATORY_CARE_PROVIDER_SITE_OTHER): Payer: Medicare Other | Admitting: Cardiology

## 2018-02-12 ENCOUNTER — Encounter: Payer: Self-pay | Admitting: Cardiology

## 2018-02-12 VITALS — BP 122/72 | HR 64 | Ht 66.5 in | Wt 131.2 lb

## 2018-02-12 DIAGNOSIS — Z79899 Other long term (current) drug therapy: Secondary | ICD-10-CM | POA: Diagnosis not present

## 2018-02-12 DIAGNOSIS — I471 Supraventricular tachycardia: Secondary | ICD-10-CM

## 2018-02-12 DIAGNOSIS — I491 Atrial premature depolarization: Secondary | ICD-10-CM

## 2018-02-12 DIAGNOSIS — R002 Palpitations: Secondary | ICD-10-CM | POA: Diagnosis not present

## 2018-02-12 MED ORDER — FLECAINIDE ACETATE 50 MG PO TABS: 25.0000 mg | ORAL_TABLET | Freq: Two times a day (BID) | ORAL | 5 refills | Status: DC

## 2018-02-12 NOTE — Patient Instructions (Addendum)
Medication Instructions:  Your physician recommends that you continue on your current medications as directed. Please refer to the Current Medication list given to you today.   Labwork: You will have labs today  Testing/Procedures: You had an EKG today  Follow-Up: Your physician wants you to follow-up in: 6 months.  You will receive a reminder letter in the mail two months in advance. If you don't receive a letter, please call our office to schedule the follow-up appointment.   Any Other Special Instructions Will Be Listed Below (If Applicable).     If you need a refill on your cardiac medications before your next appointment, please call your pharmacy.     1. Avoid all over-the-counter antihistamines except Claritin/Loratadine and Zyrtec/Cetrizine. 2. Avoid all combination including cold sinus allergies flu decongestant and sleep medications 3. You can use Robitussin DM Mucinex and Mucinex DM for cough. 4. can use Tylenol aspirin ibuprofen and naproxen but no combinations such as sleep or sinus.

## 2018-02-12 NOTE — Progress Notes (Signed)
Cardiology Office Note:    Date:  02/12/2018   ID:  Robin Doyle, DOB 18-May-1951, MRN [id number]  PCP:  Nicoletta Dress, MD  Cardiologist:  Shirlee More, MD    Referring MD: Nicoletta Dress, MD    ASSESSMENT:    1. Ectopic atrial tachycardia ([place])   2. High risk medication use   3. Heart palpitations   4. Premature atrial contractions    PLAN:    In order of problems listed above:  1. She has had a nice response and will continue very low-dose flecainide.  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  I doubt there is an association her dose is so low I do not think there is value in urine drug level. 2. Stable continue flecainide 3. Improved continue current treatment 4. Improved continue flecainide 5. 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  Her concern is she is having toxicity from antiarrhythmic drug and will check a CBC and platelet count and likely offer reassurance.  Response to flecainide is so good I be hesitant to stop the drug for this minor problem   Next appointment: One year   Medication Adjustments/Labs and Tests Ordered: Current medicines are reviewed at length with the patient today.  Concerns regarding medicines are outlined above.  Orders Placed This Encounter  Procedures  . CBC  . INR/PT  . EKG 12-Lead   Meds ordered this encounter  Medications  . flecainide (TAMBOCOR) 50 MG tablet    Sig: Take 0.5 tablets (25 mg total) by mouth 2 (two) times daily.    Dispense:  30 tablet    Refill:  5    Chief Complaint  Patient presents with  . Palpitations    History of Present Illness:    Robin Doyle is a 67 y.o. 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  No sustained arrhythmia syncope chest pain shortness of breath Past Medical History:  Diagnosis Date  . Allergy   . Arrhythmia 02/05/2017  . Arthritis    low back, posterior cervical, hips, shoulder  . Back pain 02/05/2017  . Colon polyps    hyperplastic, last study  May '09  . Ectopic atrial tachycardia ([place]) 06/24/2016  . Heart palpitations   . Hyperlipidemia LDL goal < 130 12/11/2010   Baseline LDL 160 June '12   . Premature atrial contractions 06/24/2016  . Routine general medical examination at a health care facility 12/11/2010    Past Surgical History:  Procedure Laterality Date  . APPENDECTOMY  '67  . G2P2    . PILONIDAL CYST EXCISION  '70  . TONSILLECTOMY  '54    Current Medications: Current Meds  Medication Sig  . Biotin 1000 MCG tablet Take 1,000 mcg by mouth daily.  . Cyanocobalamin (VITAMIN B 12 PO) Take 500 mcg by mouth daily.  . Flaxseed, Linseed, (FLAXSEED OIL) 1200 MG CAPS Take 600 mg by mouth daily.   . flecainide (TAMBOCOR) 50 MG tablet Take 0.5 tablets (25 mg total) by mouth 2 (two) times  daily.  . Lactobacillus (ACIDOPHILUS PO) Take 1 tablet by mouth daily as needed.   . Multiple Vitamins-Minerals (ALIVE WOMENS 50+ PO) Take 1 tablet by mouth daily.  . Omega-3 Fatty Acids (FISH OIL) 1200 MG CAPS Take 360 mg by mouth daily.   . psyllium (METAMUCIL) 0.52 G capsule Take 0.52 g by mouth daily as needed.   . [DISCONTINUED] flecainide (TAMBOCOR) 50 MG tablet Take 0.5 tablets (25 mg total) by mouth 2 (two) times daily.     Allergies:   Diflucan [fluconazole]; Codeine; Lidocaine; and Prednisone   Social History   Socioeconomic History  . Marital status: Married    Spouse name: Not on file  . Number of children: 2  . Years of education: 59  . Highest  education level: Not on file  Occupational History  . Occupation: Landscape architect  Social Needs  . Financial resource strain: Not on file  . Food insecurity:    Worry: Not on file    Inability: Not on file  . Transportation needs:    Medical: Not on file    Non-medical: Not on file  Tobacco Use  . Smoking status: Never Smoker  . Smokeless tobacco: Never Used  Substance and Sexual Activity  . Alcohol use: No  . Drug use: No  . Sexual activity: Yes    Partners: Male  Lifestyle  . Physical activity:    Days per week: Not on file    Minutes per session: Not on file  . Stress: Not on file  Relationships  . Social connections:    Talks on phone: Not on file    Gets together: Not on file    Attends religious service: Not on file    Active member of club or organization: Not on file    Attends meetings of clubs or organizations: Not on file    Relationship status: Not on file  Other Topics Concern  . Not on file  Social History Narrative   HSG, trade school - Investment banker, corporate. Married '70. 2 sons - '73, '75; 1 adopted grandson, 1 grand-daughter. Work - keeps the farm and cattle. Marriage is in good health.      Family History: The patient's family history includes Arrhythmia in her father; Arthritis in her father and mother; Atrial fibrillation in her mother; Cancer in her maternal aunt, maternal aunt, and paternal grandmother; Gout in her father and mother; Heart disease in her father and mother; Heart failure in her mother; Hyperlipidemia in her father; Hypertension in her mother; Mental retardation in her brother. There is no history of Diabetes. ROS:   Please see the history of present illness.    All other systems reviewed and are negative.  EKGs/Labs/Other Studies Reviewed:    The following studies were reviewed today:  EKG:  EKG ordered today.  The ekg ordered today demonstrates sinus rhythm normal  Recent Labs: No results found for requested labs within last 8760  hours.  Recent Lipid Panel    Component Value Date/Time   CHOL 218 (H) 12/10/2010 0802   TRIG 51.0 12/10/2010 0802   HDL 44.80 12/10/2010 0802   CHOLHDL 5 12/10/2010 0802   VLDL 10.2 12/10/2010 0802   LDLDIRECT 160.1 12/10/2010 0802    Physical Exam:    VS:  BP 122/72 (BP Location: Right Arm, Patient Position: Sitting, Cuff Size: Normal)   Pulse 64   Ht 5' 6.5" (1.689 m)   Wt 131 lb 3.2 oz (59.5 kg)   SpO2 97%  BMI 20.86 kg/m     Wt Readings from Last 3 Encounters:  02/12/18 131 lb 3.2 oz (59.5 kg)  02/09/17 129 lb 12.8 oz (58.9 kg)  02/03/13 133 lb 12.8 oz (60.7 kg)     GEN:  Well nourished, well developed in no acute distress HEENT: Normal NECK: No JVD; No carotid bruits LYMPHATICS: No lymphadenopathy CARDIAC: RRR, no murmurs, rubs, gallops RESPIRATORY:  Clear to auscultation without rales, wheezing or rhonchi  ABDOMEN: Soft, non-tender, non-distended MUSCULOSKELETAL:  No edema; No deformity  SKIN: Warm and dry NEUROLOGIC:  Alert and oriented x 3 PSYCHIATRIC:  Normal affect    Signed, Shirlee More, MD  02/12/2018 12:26 PM    [facility]

## 2018-02-13 LAB — CBC
HEMOGLOBIN: 13.7 g/dL (ref 11.1–15.9)
Hematocrit: 41.4 % (ref 34.0–46.6)
MCH: 28.8 pg (ref 26.6–33.0)
MCHC: 33.1 g/dL (ref 31.5–35.7)
MCV: 87 fL (ref 79–97)
Platelets: 270 10*3/uL (ref 150–450)
RBC: 4.75 x10E6/uL (ref 3.77–5.28)
RDW: 14.7 % (ref 12.3–15.4)
WBC: 7.6 10*3/uL (ref 3.4–10.8)

## 2018-02-13 LAB — PROTIME-INR
INR: 1 (ref 0.8–1.2)
Prothrombin Time: 10.4 s (ref 9.1–12.0)

## 2018-03-30 ENCOUNTER — Other Ambulatory Visit: Payer: Self-pay | Admitting: Internal Medicine

## 2018-03-30 DIAGNOSIS — Z1231 Encounter for screening mammogram for malignant neoplasm of breast: Secondary | ICD-10-CM | POA: Diagnosis not present

## 2018-04-28 DIAGNOSIS — Z860101 Personal history of adenomatous and serrated colon polyps: Secondary | ICD-10-CM

## 2018-04-28 DIAGNOSIS — K648 Other hemorrhoids: Secondary | ICD-10-CM | POA: Insufficient documentation

## 2018-04-28 DIAGNOSIS — Z86010 Personal history of colonic polyps: Secondary | ICD-10-CM | POA: Insufficient documentation

## 2018-04-28 HISTORY — DX: Other hemorrhoids: K64.8

## 2018-04-28 HISTORY — DX: Personal history of adenomatous and serrated colon polyps: Z86.0101

## 2018-08-05 DIAGNOSIS — M25511 Pain in right shoulder: Secondary | ICD-10-CM | POA: Diagnosis not present

## 2018-09-02 ENCOUNTER — Ambulatory Visit: Payer: Medicare Other | Admitting: Cardiology

## 2018-11-10 DIAGNOSIS — Z136 Encounter for screening for cardiovascular disorders: Secondary | ICD-10-CM | POA: Diagnosis not present

## 2018-11-10 DIAGNOSIS — Z9181 History of falling: Secondary | ICD-10-CM | POA: Diagnosis not present

## 2018-11-10 DIAGNOSIS — Z1331 Encounter for screening for depression: Secondary | ICD-10-CM | POA: Diagnosis not present

## 2018-11-10 DIAGNOSIS — E785 Hyperlipidemia, unspecified: Secondary | ICD-10-CM | POA: Diagnosis not present

## 2018-11-10 DIAGNOSIS — Z0000 Encounter for general adult medical examination without abnormal findings: Secondary | ICD-10-CM | POA: Diagnosis not present

## 2018-11-10 DIAGNOSIS — Z1231 Encounter for screening mammogram for malignant neoplasm of breast: Secondary | ICD-10-CM | POA: Diagnosis not present

## 2018-11-10 DIAGNOSIS — N959 Unspecified menopausal and perimenopausal disorder: Secondary | ICD-10-CM | POA: Diagnosis not present

## 2018-11-22 ENCOUNTER — Telehealth: Payer: Self-pay | Admitting: Cardiology

## 2018-11-22 NOTE — Progress Notes (Signed)
Virtual Visit via Telephone Note   Despite having a smart phone she declined  a video visit This visit type was conducted due to national recommendations for restrictions regarding the COVID-19 Pandemic (e.g. social distancing) in an effort to limit this patient's exposure and mitigate transmission in our community.  Due to her co-morbid illnesses, this patient is at least at moderate risk for complications without adequate follow up.  This format is felt to be most appropriate for this patient at this time.  The patient did not have access to video technology/had technical difficulties with video requiring transitioning to audio format only (telephone).  All issues noted in this document were discussed and addressed.  No physical exam could be performed with this format.  Please refer to the patient's chart for her  consent to telehealth for [facility].   Date:  11/23/2018   ID:  Robin Doyle, DOB 12-10-1950, MRN [id number]  Patient Location: Home Provider Location: Office  PCP:  Nicoletta Dress, MD  Cardiologist:  Shirlee More, MD  Electrophysiologist:  None   Evaluation Performed:  Follow-Up Visit  Chief Complaint: Atrial tachycardia  History of Present Illness:    Robin Doyle is a 68 y.o. female with symptomatic APCs and atrial tachycardia last seen August 2019 because of changes in her skin she discontinued flecainide she has intermittent palpitation not severe sustained a rapid and has no exercise intolerance chest pain shortness of breath or edema.  As she is not taken antiarrhythmic drug we discussed the option of having a small amount of a short acting beta-blocker to take PRN and she is given a prescription for Lopressor.  If she is having frequent episodes we need to reconsider antiarrhythmic treatment.  She will continue to avoid over-the-counter proarrhythmic drugs.  She has had an exposure to COVID-19 and is since self quarantine.  The patient does not have  symptoms concerning for COVID-19 infection (fever, chills, cough, or new shortness of breath).    Past Medical History:  Diagnosis Date  . Allergy   . Arrhythmia 02/05/2017  . Arthritis    low back, posterior cervical, hips, shoulder  . Back pain 02/05/2017  . Colon polyps    hyperplastic, last study  May '09  . Ectopic atrial tachycardia ([place]) 06/24/2016  . Heart palpitations   . Hyperlipidemia LDL goal < 130 12/11/2010   Baseline LDL 160 June '12   . Premature atrial contractions 06/24/2016  . Routine general medical examination at a health care facility 12/11/2010   Past Surgical History:  Procedure Laterality Date  . APPENDECTOMY  '67  . G2P2    . PILONIDAL CYST EXCISION  '70  . TONSILLECTOMY  '54     Current Meds  Medication Sig  . Biotin 1000 MCG tablet Take 1,000 mcg by mouth daily.  . Coenzyme Q10 (CO Q 10 PO) Take 1 capsule by mouth daily.  . Cyanocobalamin (VITAMIN B 12 PO) Take 500 mcg by mouth daily.  . Flaxseed, Linseed, (FLAXSEED OIL) 1200 MG CAPS Take 600 mg by mouth daily.   Marland Kitchen GRAPE SEED EXTRACT PO Take 1 tablet by mouth every other day.  . Lactobacillus (ACIDOPHILUS PO) Take 1 tablet by mouth daily as needed.   . Magnesium 250 MG TABS Take 1 tablet by mouth daily.  . Multiple Vitamins-Minerals (ALIVE WOMENS 50+ PO) Take 1 tablet by mouth daily.  . Omega-3 Fatty Acids (FISH OIL) 1200 MG CAPS Take 360 mg by mouth  daily.      Allergies:   Flecainide; Diflucan [fluconazole]; Codeine; Lidocaine; and Prednisone   Social History   Tobacco Use  . Smoking status: Never Smoker  . Smokeless tobacco: Never Used  Substance Use Topics  . Alcohol use: No  . Drug use: No     Family Hx: The patient's family history includes Arrhythmia in her father; Arthritis in her father and mother; Atrial fibrillation in her mother; Cancer in her maternal aunt, maternal aunt, and paternal grandmother; Gout in her father and mother; Heart disease in her father and mother; Heart failure  in her mother; Hyperlipidemia in her father; Hypertension in her mother; Mental retardation in her brother. There is no history of Diabetes.  ROS:   Please see the history of present illness.     All other systems reviewed and are negative.   Prior CV studies:   The following studies were reviewed today:    Labs/Other Tests and Data Reviewed:    EKG:  No ECG reviewed.  Recent Labs: 02/12/2018: Hemoglobin 13.7; Platelets 270   Recent Lipid Panel Lab Results  Component Value Date/Time   CHOL 218 (H) 12/10/2010 08:02 AM   TRIG 51.0 12/10/2010 08:02 AM   HDL 44.80 12/10/2010 08:02 AM   CHOLHDL 5 12/10/2010 08:02 AM   LDLDIRECT 160.1 12/10/2010 08:02 AM    Wt Readings from Last 3 Encounters:  11/23/18 135 lb (61.2 kg)  02/12/18 131 lb 3.2 oz (59.5 kg)  02/09/17 129 lb 12.8 oz (58.9 kg)     Objective:    Vital Signs:  BP (!) 108/54   Pulse 73   Ht 5' 6.5" (1.689 m)   Wt 135 lb (61.2 kg)   BMI 21.46 kg/m    VITAL SIGNS:  reviewed Alert and oriented x3 mood affect thought and cognition are normal no audible wheezing or respiratory distress in conversation  ASSESSMENT & PLAN:    1. Topic atrial tachycardia/symptomatic APCs improved no longer on antiarrhythmic drug and she will take a beta-blocker as needed in the future. 2. No longer taking flecainide due to changes in her skin 3. Continue fish offered to have her come to my office for labs including lipid profile which she is declined and prefers to see me face-to-face 6 months 4. Continue and complete her 14-day self quarantine after exposure to COVID-19 in a medical office  COVID-19 Education: The signs and symptoms of COVID-19 were discussed with the patient and how to seek care for testing (follow up with PCP or arrange E-visit).  The importance of social distancing was discussed today.  Time:   Today, I have spent 25 minutes with the patient with telehealth technology discussing the above problems.      Medication Adjustments/Labs and Tests Ordered: Current medicines are reviewed at length with the patient today.  Concerns regarding medicines are outlined above.   Tests Ordered: No orders of the defined types were placed in this encounter.   Medication Changes: No orders of the defined types were placed in this encounter.   Disposition:  Follow up in 6 month(s)  Signed, Shirlee More, MD  11/23/2018 9:53 AM    [facility] Date:  11/23/2018

## 2018-11-22 NOTE — Telephone Encounter (Signed)
Virtual Visit Pre-Appointment Phone Call  "(Name), I am calling you today to discuss your upcoming appointment. We are currently trying to limit exposure to the virus that causes COVID-19 by seeing patients at home rather than in the office."  1. "What is the BEST phone number to call the day of the visit?" - include this in appointment notes  2. Do you have or have access to (through a family member/friend) a smartphone with video capability that we can use for your visit?" a. If yes - list this number in appt notes as cell (if different from BEST phone #) and list the appointment type as a VIDEO visit in appointment notes b. If no - list the appointment type as a PHONE visit in appointment notes  Confirm consent - "In the setting of the current Covid19 crisis, you are scheduled for a (phone or video) visit with your provider on (date) at (time).  Just as we do with many in-office visits, in order for you to participate in this visit, we must obtain consent.  If you'd like, I can send this to your mychart (if signed up) or email for you to review.  Otherwise, I can obtain your verbal consent now.  All virtual visits are billed to your insurance company just like a normal visit would be.  By agreeing to a virtual visit, we'd like you to understand that the technology does not allow for your provider to perform an examination, and thus may limit your provider's ability to fully assess your condition. If your provider identifies any concerns that need to be evaluated in person, we will make arrangements to do so.  Finally, though the technology is pretty good, we cannot assure that it will always work on either your or our end, and in the setting of a video visit, we may have to convert it to a phone-only visit.  In either situation, we cannot ensure that we have a secure connection.  Are you willing to proceed?" STAFF: Did the patient verbally acknowledge consent to telehealth visit? Document  YES/NO here: YES 3. Advise patient to be prepared - "Two hours prior to your appointment, go ahead and check your blood pressure, pulse, oxygen saturation, and your weight (if you have the equipment to check those) and write them all down. When your visit starts, your provider will ask you for this information. If you have an Apple Watch or Kardia device, please plan to have heart rate information ready on the day of your appointment. Please have a pen and paper handy nearby the day of the visit as well."  4. Give patient instructions for MyChart download to smartphone OR [email or web address] as below if video visit (depending on what platform provider is using)  5. Inform patient they will receive a phone call 15 minutes prior to their appointment time (may be from unknown caller ID) so they should be prepared to answer    TELEPHONE CALL NOTE  Robin Doyle has been deemed a candidate for a follow-up tele-health visit to limit community exposure during the Covid-19 pandemic. I spoke with the patient via phone to ensure availability of phone/video source, confirm preferred email & phone number, and discuss instructions and expectations.  I reminded Robin Doyle to be prepared with any vital sign and/or heart rhythm information that could potentially be obtained via home monitoring, at the time of her visit. I reminded Robin Doyle to expect a phone call prior to her visit.  Janine Limbo  11/22/2018 1:24 PM   INSTRUCTIONS FOR DOWNLOADING THE MYCHART APP TO SMARTPHONE  - The patient must first make sure to have activated MyChart and know their login information - If Apple, go to [employer] and type in MyChart in the search bar and download the app. If Android, ask patient to go to [employer] and type in [place] in the search bar and download the app. The app is free but as with any other app downloads, their phone may require them to verify saved payment information or Apple/Android password.    - The patient will need to then log into the app with their MyChart username and password, and select [facility] as their healthcare provider to link the account. When it is time for your visit, go to the MyChart app, find appointments, and click Begin Video Visit. Be sure to Select Allow for your device to access the Microphone and Camera for your visit. You will then be connected, and your provider will be with you shortly.  **If they have any issues connecting, or need assistance please contact MyChart service desk [phone])**  **If using a computer, in order to ensure the best quality for their visit they will need to use either of the following Internet Browsers: [employer], or Google Chrome**  IF USING DOXIMITY or DOXY.ME - The patient will receive a link just prior to their visit by text.     FULL LENGTH CONSENT FOR TELE-HEALTH VISIT   I hereby voluntarily request, consent and authorize [place] and its employed or contracted physicians, physician assistants, nurse practitioners or other licensed health care professionals (the Practitioner), to provide me with telemedicine health care services (the Services") as deemed necessary by the treating Practitioner. I acknowledge and consent to receive the Services by the Practitioner via telemedicine. I understand that the telemedicine visit will involve communicating with the Practitioner through live audiovisual communication technology and the disclosure of certain medical information by electronic transmission. I acknowledge that I have been given the opportunity to request an in-person assessment or other available alternative prior to the telemedicine visit and am voluntarily participating in the telemedicine visit.  I understand that I have the right to withhold or withdraw my consent to the use of telemedicine in the course of my care at any time, without affecting my right to future care or treatment, and that  the Practitioner or I may terminate the telemedicine visit at any time. I understand that I have the right to inspect all information obtained and/or recorded in the course of the telemedicine visit and may receive copies of available information for a reasonable fee.  I understand that some of the potential risks of receiving the Services via telemedicine include:   Delay or interruption in medical evaluation due to technological equipment failure or disruption;  Information transmitted may not be sufficient (e.g. poor resolution of images) to allow for appropriate medical decision making by the Practitioner; and/or   In rare instances, security protocols could fail, causing a breach of personal health information.  Furthermore, I acknowledge that it is my responsibility to provide information about my medical history, conditions and care that is complete and accurate to the best of my ability. I acknowledge that Practitioner's advice, recommendations, and/or decision may be based on factors not within their control, such as incomplete or inaccurate data provided by me or distortions of diagnostic images or specimens that may result from electronic transmissions. I understand that the practice of  medicine is not an Chief Strategy Officer and that Practitioner makes no warranties or guarantees regarding treatment outcomes. I acknowledge that I will receive a copy of this consent concurrently upon execution via email to the email address I last provided but may also request a printed copy by calling the office of [place].    I understand that my insurance will be billed for this visit.   I have read or had this consent read to me.  I understand the contents of this consent, which adequately explains the benefits and risks of the Services being provided via telemedicine.   I have been provided ample opportunity to ask questions regarding this consent and the Services and have had my questions answered to  my satisfaction.  I give my informed consent for the services to be provided through the use of telemedicine in my medical care  By participating in this telemedicine visit I agree to the above.

## 2018-11-23 ENCOUNTER — Other Ambulatory Visit: Payer: Self-pay

## 2018-11-23 ENCOUNTER — Encounter: Payer: Self-pay | Admitting: Cardiology

## 2018-11-23 ENCOUNTER — Telehealth (INDEPENDENT_AMBULATORY_CARE_PROVIDER_SITE_OTHER): Payer: Medicare Other | Admitting: Cardiology

## 2018-11-23 VITALS — BP 108/54 | HR 73 | Ht 66.5 in | Wt 135.0 lb

## 2018-11-23 DIAGNOSIS — I471 Supraventricular tachycardia: Secondary | ICD-10-CM | POA: Diagnosis not present

## 2018-11-23 DIAGNOSIS — Z7189 Other specified counseling: Secondary | ICD-10-CM

## 2018-11-23 DIAGNOSIS — E785 Hyperlipidemia, unspecified: Secondary | ICD-10-CM

## 2018-11-23 DIAGNOSIS — Z79899 Other long term (current) drug therapy: Secondary | ICD-10-CM

## 2018-11-23 MED ORDER — METOPROLOL TARTRATE 25 MG PO TABS: ORAL_TABLET | ORAL | 3 refills | Status: AC

## 2018-11-23 NOTE — Patient Instructions (Addendum)
Medication Instructions:  Your physician has recommended you make the following change in your medication:   START metoprolol tartrate (lopressor) 25 mg: Take 1 tablet (25 mg) daily as needed for heart rate greater than 130.  If you need a refill on your cardiac medications before your next appointment, please call your pharmacy.   Lab work: None  If you have labs (blood work) drawn today and your tests are completely normal, you will receive your results only by: Marland Kitchen MyChart Message (if you have MyChart) OR . A paper copy in the mail If you have any lab test that is abnormal or we need to change your treatment, we will call you to review the results.  Testing/Procedures: None  Follow-Up: At [facility], you and your health needs are our priority.  As part of our continuing mission to provide you with exceptional heart care, we have created designated Provider Care Teams.  These Care Teams include your primary Cardiologist (physician) and Advanced Practice Providers (APPs -  Physician Assistants and Nurse Practitioners) who all work together to provide you with the care you need, when you need it. You will need a follow up appointment in 6 months: Wednesday, 05/25/2019, at 9:40 am in the [place] office.     Metoprolol tablets What is this medicine? METOPROLOL (me TOE proe lole) is a beta-blocker. Beta-blockers reduce the workload on the heart and help it to beat more regularly. This medicine is used to treat high blood pressure and to prevent chest pain. It is also used to after a heart attack and to prevent an additional heart attack from occurring. This medicine may be used for other purposes; ask your health care provider or pharmacist if you have questions. COMMON BRAND NAME(S): Lopressor What should I tell my health care provider before I take this medicine? They need to know if you have any of these conditions: -diabetes -heart or vessel disease like slow heart rate, worsening  heart failure, heart block, sick sinus syndrome or Raynaud's disease -kidney disease -liver disease -lung or breathing disease, like asthma or emphysema -pheochromocytoma -thyroid disease -an unusual or allergic reaction to metoprolol, other beta-blockers, medicines, foods, dyes, or preservatives -pregnant or trying to get pregnant -breast-feeding How should I use this medicine? Take this medicine by mouth with a drink of water. Follow the directions on the prescription label. Take this medicine immediately after meals. Take your doses at regular intervals. Do not take more medicine than directed. Do not stop taking this medicine suddenly. This could lead to serious heart-related effects. Talk to your pediatrician regarding the use of this medicine in children. Special care may be needed. Overdosage: If you think you have taken too much of this medicine contact a poison control center or emergency room at once. NOTE: This medicine is only for you. Do not share this medicine with others. What if I miss a dose? If you miss a dose, take it as soon as you can. If it is almost time for your next dose, take only that dose. Do not take double or extra doses. What may interact with this medicine? This medicine may interact with the following medications: -certain medicines for blood pressure, heart disease, irregular heart beat -certain medicines for depression like monoamine oxidase (MAO) inhibitors, fluoxetine, or paroxetine -clonidine -dobutamine -epinephrine -isoproterenol -reserpine This list may not describe all possible interactions. Give your health care provider a list of all the medicines, herbs, non-prescription drugs, or dietary supplements you use. Also tell them  if you smoke, drink alcohol, or use illegal drugs. Some items may interact with your medicine. What should I watch for while using this medicine? Visit your doctor or health care professional for regular check ups. Contact  your doctor right away if your symptoms worsen. Check your blood pressure and pulse rate regularly. Ask your health care professional what your blood pressure and pulse rate should be, and when you should contact them. You may get drowsy or dizzy. Do not drive, use machinery, or do anything that needs mental alertness until you know how this medicine affects you. Do not sit or stand up quickly, especially if you are an older patient. This reduces the risk of dizzy or fainting spells. Contact your doctor if these symptoms continue. Alcohol may interfere with the effect of this medicine. Avoid alcoholic drinks. What side effects may I notice from receiving this medicine? Side effects that you should report to your doctor or health care professional as soon as possible: -allergic reactions like skin rash, itching or hives -cold or numb hands or feet -depression -difficulty breathing -faint -fever with sore throat -irregular heartbeat, chest pain -rapid weight gain -swollen legs or ankles Side effects that usually do not require medical attention (report to your doctor or health care professional if they continue or are bothersome): -anxiety or nervousness -change in sex drive or performance -dry skin -headache -nightmares or trouble sleeping -short term memory loss -stomach upset or diarrhea -unusually tired This list may not describe all possible side effects. Call your doctor for medical advice about side effects. You may report side effects to FDA at 1-800-FDA-1088. Where should I keep my medicine? Keep out of the reach of children. Store at room temperature between 15 and 30 degrees C (59 and 86 degrees F). Throw away any unused medicine after the expiration date. NOTE: This sheet is a summary. It may not cover all possible information. If you have questions about this medicine, talk to your doctor, pharmacist, or health care provider.  2019 Elsevier/Gold Standard (2013-02-11  14:40:36)

## 2019-05-10 DIAGNOSIS — Z6821 Body mass index (BMI) 21.0-21.9, adult: Secondary | ICD-10-CM | POA: Diagnosis not present

## 2019-05-10 DIAGNOSIS — Z124 Encounter for screening for malignant neoplasm of cervix: Secondary | ICD-10-CM | POA: Diagnosis not present

## 2019-05-10 DIAGNOSIS — R339 Retention of urine, unspecified: Secondary | ICD-10-CM | POA: Diagnosis not present

## 2019-05-25 ENCOUNTER — Ambulatory Visit: Payer: Medicare Other | Admitting: Cardiology

## 2019-07-01 ENCOUNTER — Ambulatory Visit: Payer: Medicare Other | Admitting: Cardiology

## 2019-08-22 DIAGNOSIS — Z1231 Encounter for screening mammogram for malignant neoplasm of breast: Secondary | ICD-10-CM | POA: Diagnosis not present

## 2019-11-08 ENCOUNTER — Telehealth: Payer: Self-pay

## 2019-11-08 DIAGNOSIS — I421 Obstructive hypertrophic cardiomyopathy: Secondary | ICD-10-CM

## 2019-11-08 NOTE — Telephone Encounter (Signed)
Spoke to the patient just now and let her know that Dr. Bettina Gavia is recommending that she has an echo with contrast done and that she follow up with him afterward. She verbalizes understanding of this. The order has been placed.  I am routing to scheduling to schedule the echo and the follow up visit with this patient.

## 2019-11-24 ENCOUNTER — Ambulatory Visit (INDEPENDENT_AMBULATORY_CARE_PROVIDER_SITE_OTHER): Payer: Medicare Other

## 2019-11-24 ENCOUNTER — Other Ambulatory Visit: Payer: Self-pay

## 2019-11-24 DIAGNOSIS — I421 Obstructive hypertrophic cardiomyopathy: Secondary | ICD-10-CM | POA: Diagnosis not present

## 2019-11-24 NOTE — Progress Notes (Signed)
  Echocardiogram 2D Echocardiogram has been performed.  Robin Doyle 11/24/19 10:38 am

## 2019-11-25 ENCOUNTER — Telehealth: Payer: Self-pay

## 2019-11-25 NOTE — Telephone Encounter (Signed)
Spoke with patients husband regarding results and recommendation.  Patient verbalizes understanding and is agreeable to plan of care. Advised patient to call back with any issues or concerns.

## 2020-03-13 DIAGNOSIS — N819 Female genital prolapse, unspecified: Secondary | ICD-10-CM | POA: Diagnosis not present

## 2020-05-21 DIAGNOSIS — J208 Acute bronchitis due to other specified organisms: Secondary | ICD-10-CM | POA: Diagnosis not present

## 2020-05-21 DIAGNOSIS — J019 Acute sinusitis, unspecified: Secondary | ICD-10-CM | POA: Diagnosis not present

## 2020-05-21 DIAGNOSIS — B9689 Other specified bacterial agents as the cause of diseases classified elsewhere: Secondary | ICD-10-CM | POA: Diagnosis not present

## 2020-07-18 DIAGNOSIS — Z9181 History of falling: Secondary | ICD-10-CM | POA: Diagnosis not present

## 2020-07-18 DIAGNOSIS — Z1331 Encounter for screening for depression: Secondary | ICD-10-CM | POA: Diagnosis not present

## 2020-07-18 DIAGNOSIS — Z0000 Encounter for general adult medical examination without abnormal findings: Secondary | ICD-10-CM | POA: Diagnosis not present

## 2020-07-19 DIAGNOSIS — R7989 Other specified abnormal findings of blood chemistry: Secondary | ICD-10-CM | POA: Diagnosis not present

## 2020-07-19 DIAGNOSIS — E785 Hyperlipidemia, unspecified: Secondary | ICD-10-CM | POA: Diagnosis not present

## 2020-07-19 DIAGNOSIS — I471 Supraventricular tachycardia: Secondary | ICD-10-CM | POA: Diagnosis not present

## 2020-09-19 DIAGNOSIS — N760 Acute vaginitis: Secondary | ICD-10-CM | POA: Diagnosis not present

## 2020-10-03 DIAGNOSIS — H25813 Combined forms of age-related cataract, bilateral: Secondary | ICD-10-CM | POA: Diagnosis not present

## 2020-12-04 DIAGNOSIS — L820 Inflamed seborrheic keratosis: Secondary | ICD-10-CM | POA: Diagnosis not present

## 2020-12-04 DIAGNOSIS — D485 Neoplasm of uncertain behavior of skin: Secondary | ICD-10-CM | POA: Diagnosis not present

## 2020-12-04 DIAGNOSIS — D23112 Other benign neoplasm of skin of right lower eyelid, including canthus: Secondary | ICD-10-CM | POA: Diagnosis not present

## 2021-01-21 DIAGNOSIS — R339 Retention of urine, unspecified: Secondary | ICD-10-CM | POA: Diagnosis not present

## 2021-04-01 DIAGNOSIS — R339 Retention of urine, unspecified: Secondary | ICD-10-CM | POA: Diagnosis not present

## 2021-04-01 DIAGNOSIS — N819 Female genital prolapse, unspecified: Secondary | ICD-10-CM | POA: Diagnosis not present

## 2021-04-09 DIAGNOSIS — J019 Acute sinusitis, unspecified: Secondary | ICD-10-CM | POA: Diagnosis not present

## 2021-04-09 DIAGNOSIS — B9689 Other specified bacterial agents as the cause of diseases classified elsewhere: Secondary | ICD-10-CM | POA: Diagnosis not present

## 2021-04-19 DIAGNOSIS — N8189 Other female genital prolapse: Secondary | ICD-10-CM | POA: Diagnosis not present

## 2021-05-01 DIAGNOSIS — D251 Intramural leiomyoma of uterus: Secondary | ICD-10-CM | POA: Diagnosis not present

## 2021-05-01 DIAGNOSIS — N819 Female genital prolapse, unspecified: Secondary | ICD-10-CM | POA: Diagnosis not present

## 2021-05-09 DIAGNOSIS — M6281 Muscle weakness (generalized): Secondary | ICD-10-CM | POA: Diagnosis not present

## 2021-05-09 DIAGNOSIS — M545 Low back pain, unspecified: Secondary | ICD-10-CM | POA: Diagnosis not present

## 2021-05-09 DIAGNOSIS — R293 Abnormal posture: Secondary | ICD-10-CM | POA: Diagnosis not present

## 2021-05-15 DIAGNOSIS — M6281 Muscle weakness (generalized): Secondary | ICD-10-CM | POA: Diagnosis not present

## 2021-05-15 DIAGNOSIS — R293 Abnormal posture: Secondary | ICD-10-CM | POA: Diagnosis not present

## 2021-05-15 DIAGNOSIS — M545 Low back pain, unspecified: Secondary | ICD-10-CM | POA: Diagnosis not present

## 2021-05-23 DIAGNOSIS — M545 Low back pain, unspecified: Secondary | ICD-10-CM | POA: Diagnosis not present

## 2021-05-23 DIAGNOSIS — R293 Abnormal posture: Secondary | ICD-10-CM | POA: Diagnosis not present

## 2021-05-23 DIAGNOSIS — M6281 Muscle weakness (generalized): Secondary | ICD-10-CM | POA: Diagnosis not present

## 2021-05-31 DIAGNOSIS — R293 Abnormal posture: Secondary | ICD-10-CM | POA: Diagnosis not present

## 2021-05-31 DIAGNOSIS — M545 Low back pain, unspecified: Secondary | ICD-10-CM | POA: Diagnosis not present

## 2021-05-31 DIAGNOSIS — M6281 Muscle weakness (generalized): Secondary | ICD-10-CM | POA: Diagnosis not present

## 2021-06-03 DIAGNOSIS — M25511 Pain in right shoulder: Secondary | ICD-10-CM | POA: Diagnosis not present

## 2021-06-06 DIAGNOSIS — M6281 Muscle weakness (generalized): Secondary | ICD-10-CM | POA: Diagnosis not present

## 2021-06-06 DIAGNOSIS — R293 Abnormal posture: Secondary | ICD-10-CM | POA: Diagnosis not present

## 2021-06-06 DIAGNOSIS — M545 Low back pain, unspecified: Secondary | ICD-10-CM | POA: Diagnosis not present

## 2021-06-10 DIAGNOSIS — R293 Abnormal posture: Secondary | ICD-10-CM | POA: Diagnosis not present

## 2021-06-10 DIAGNOSIS — M6281 Muscle weakness (generalized): Secondary | ICD-10-CM | POA: Diagnosis not present

## 2021-06-10 DIAGNOSIS — M545 Low back pain, unspecified: Secondary | ICD-10-CM | POA: Diagnosis not present

## 2021-06-20 DIAGNOSIS — R293 Abnormal posture: Secondary | ICD-10-CM | POA: Diagnosis not present

## 2021-06-20 DIAGNOSIS — M6281 Muscle weakness (generalized): Secondary | ICD-10-CM | POA: Diagnosis not present

## 2021-06-20 DIAGNOSIS — M545 Low back pain, unspecified: Secondary | ICD-10-CM | POA: Diagnosis not present

## 2021-06-26 DIAGNOSIS — M6281 Muscle weakness (generalized): Secondary | ICD-10-CM | POA: Diagnosis not present

## 2021-06-26 DIAGNOSIS — M545 Low back pain, unspecified: Secondary | ICD-10-CM | POA: Diagnosis not present

## 2021-06-26 DIAGNOSIS — R293 Abnormal posture: Secondary | ICD-10-CM | POA: Diagnosis not present

## 2021-07-02 DIAGNOSIS — R293 Abnormal posture: Secondary | ICD-10-CM | POA: Diagnosis not present

## 2021-07-02 DIAGNOSIS — M545 Low back pain, unspecified: Secondary | ICD-10-CM | POA: Diagnosis not present

## 2021-07-02 DIAGNOSIS — M6281 Muscle weakness (generalized): Secondary | ICD-10-CM | POA: Diagnosis not present

## 2021-07-03 DIAGNOSIS — L814 Other melanin hyperpigmentation: Secondary | ICD-10-CM | POA: Diagnosis not present

## 2021-07-03 DIAGNOSIS — L209 Atopic dermatitis, unspecified: Secondary | ICD-10-CM | POA: Diagnosis not present

## 2021-07-03 DIAGNOSIS — L570 Actinic keratosis: Secondary | ICD-10-CM | POA: Diagnosis not present

## 2021-07-03 DIAGNOSIS — L820 Inflamed seborrheic keratosis: Secondary | ICD-10-CM | POA: Diagnosis not present

## 2021-07-03 DIAGNOSIS — L578 Other skin changes due to chronic exposure to nonionizing radiation: Secondary | ICD-10-CM | POA: Diagnosis not present

## 2021-07-08 DIAGNOSIS — M6281 Muscle weakness (generalized): Secondary | ICD-10-CM | POA: Diagnosis not present

## 2021-07-08 DIAGNOSIS — M545 Low back pain, unspecified: Secondary | ICD-10-CM | POA: Diagnosis not present

## 2021-07-08 DIAGNOSIS — R293 Abnormal posture: Secondary | ICD-10-CM | POA: Diagnosis not present

## 2021-07-10 DIAGNOSIS — R293 Abnormal posture: Secondary | ICD-10-CM | POA: Diagnosis not present

## 2021-07-10 DIAGNOSIS — M6281 Muscle weakness (generalized): Secondary | ICD-10-CM | POA: Diagnosis not present

## 2021-07-10 DIAGNOSIS — M545 Low back pain, unspecified: Secondary | ICD-10-CM | POA: Diagnosis not present

## 2021-07-16 DIAGNOSIS — R293 Abnormal posture: Secondary | ICD-10-CM | POA: Diagnosis not present

## 2021-07-16 DIAGNOSIS — M545 Low back pain, unspecified: Secondary | ICD-10-CM | POA: Diagnosis not present

## 2021-07-16 DIAGNOSIS — M6281 Muscle weakness (generalized): Secondary | ICD-10-CM | POA: Diagnosis not present

## 2021-07-18 DIAGNOSIS — M6281 Muscle weakness (generalized): Secondary | ICD-10-CM | POA: Diagnosis not present

## 2021-07-18 DIAGNOSIS — M545 Low back pain, unspecified: Secondary | ICD-10-CM | POA: Diagnosis not present

## 2021-07-18 DIAGNOSIS — R293 Abnormal posture: Secondary | ICD-10-CM | POA: Diagnosis not present

## 2021-07-22 DIAGNOSIS — R293 Abnormal posture: Secondary | ICD-10-CM | POA: Diagnosis not present

## 2021-07-22 DIAGNOSIS — M6281 Muscle weakness (generalized): Secondary | ICD-10-CM | POA: Diagnosis not present

## 2021-07-22 DIAGNOSIS — M545 Low back pain, unspecified: Secondary | ICD-10-CM | POA: Diagnosis not present

## 2021-07-24 DIAGNOSIS — M6281 Muscle weakness (generalized): Secondary | ICD-10-CM | POA: Diagnosis not present

## 2021-07-24 DIAGNOSIS — R293 Abnormal posture: Secondary | ICD-10-CM | POA: Diagnosis not present

## 2021-07-24 DIAGNOSIS — M545 Low back pain, unspecified: Secondary | ICD-10-CM | POA: Diagnosis not present

## 2021-07-30 DIAGNOSIS — R293 Abnormal posture: Secondary | ICD-10-CM | POA: Diagnosis not present

## 2021-07-30 DIAGNOSIS — M545 Low back pain, unspecified: Secondary | ICD-10-CM | POA: Diagnosis not present

## 2021-07-30 DIAGNOSIS — M6281 Muscle weakness (generalized): Secondary | ICD-10-CM | POA: Diagnosis not present

## 2021-08-01 DIAGNOSIS — M6281 Muscle weakness (generalized): Secondary | ICD-10-CM | POA: Diagnosis not present

## 2021-08-01 DIAGNOSIS — M545 Low back pain, unspecified: Secondary | ICD-10-CM | POA: Diagnosis not present

## 2021-08-01 DIAGNOSIS — R293 Abnormal posture: Secondary | ICD-10-CM | POA: Diagnosis not present

## 2021-08-07 DIAGNOSIS — R293 Abnormal posture: Secondary | ICD-10-CM | POA: Diagnosis not present

## 2021-08-07 DIAGNOSIS — M6281 Muscle weakness (generalized): Secondary | ICD-10-CM | POA: Diagnosis not present

## 2021-08-07 DIAGNOSIS — M545 Low back pain, unspecified: Secondary | ICD-10-CM | POA: Diagnosis not present

## 2021-08-15 DIAGNOSIS — M545 Low back pain, unspecified: Secondary | ICD-10-CM | POA: Diagnosis not present

## 2021-08-15 DIAGNOSIS — M6281 Muscle weakness (generalized): Secondary | ICD-10-CM | POA: Diagnosis not present

## 2021-08-15 DIAGNOSIS — R293 Abnormal posture: Secondary | ICD-10-CM | POA: Diagnosis not present

## 2021-08-21 DIAGNOSIS — R293 Abnormal posture: Secondary | ICD-10-CM | POA: Diagnosis not present

## 2021-08-21 DIAGNOSIS — M6281 Muscle weakness (generalized): Secondary | ICD-10-CM | POA: Diagnosis not present

## 2021-08-21 DIAGNOSIS — M545 Low back pain, unspecified: Secondary | ICD-10-CM | POA: Diagnosis not present

## 2021-08-27 DIAGNOSIS — M6281 Muscle weakness (generalized): Secondary | ICD-10-CM | POA: Diagnosis not present

## 2021-08-27 DIAGNOSIS — R293 Abnormal posture: Secondary | ICD-10-CM | POA: Diagnosis not present

## 2021-08-27 DIAGNOSIS — M545 Low back pain, unspecified: Secondary | ICD-10-CM | POA: Diagnosis not present

## 2021-09-02 DIAGNOSIS — M6281 Muscle weakness (generalized): Secondary | ICD-10-CM | POA: Diagnosis not present

## 2021-09-02 DIAGNOSIS — M545 Low back pain, unspecified: Secondary | ICD-10-CM | POA: Diagnosis not present

## 2021-09-02 DIAGNOSIS — R293 Abnormal posture: Secondary | ICD-10-CM | POA: Diagnosis not present

## 2021-09-04 DIAGNOSIS — N813 Complete uterovaginal prolapse: Secondary | ICD-10-CM | POA: Diagnosis not present

## 2021-09-04 DIAGNOSIS — N816 Rectocele: Secondary | ICD-10-CM | POA: Diagnosis not present

## 2021-09-04 DIAGNOSIS — R3915 Urgency of urination: Secondary | ICD-10-CM | POA: Diagnosis not present

## 2021-09-10 DIAGNOSIS — R293 Abnormal posture: Secondary | ICD-10-CM | POA: Diagnosis not present

## 2021-09-10 DIAGNOSIS — M545 Low back pain, unspecified: Secondary | ICD-10-CM | POA: Diagnosis not present

## 2021-09-10 DIAGNOSIS — M6281 Muscle weakness (generalized): Secondary | ICD-10-CM | POA: Diagnosis not present

## 2021-09-18 DIAGNOSIS — M6281 Muscle weakness (generalized): Secondary | ICD-10-CM | POA: Diagnosis not present

## 2021-09-18 DIAGNOSIS — R293 Abnormal posture: Secondary | ICD-10-CM | POA: Diagnosis not present

## 2021-09-18 DIAGNOSIS — M545 Low back pain, unspecified: Secondary | ICD-10-CM | POA: Diagnosis not present

## 2021-12-11 DIAGNOSIS — M21611 Bunion of right foot: Secondary | ICD-10-CM | POA: Diagnosis not present

## 2021-12-11 DIAGNOSIS — L851 Acquired keratosis [keratoderma] palmaris et plantaris: Secondary | ICD-10-CM | POA: Diagnosis not present

## 2021-12-11 DIAGNOSIS — M2042 Other hammer toe(s) (acquired), left foot: Secondary | ICD-10-CM | POA: Diagnosis not present

## 2021-12-11 DIAGNOSIS — M2041 Other hammer toe(s) (acquired), right foot: Secondary | ICD-10-CM | POA: Diagnosis not present

## 2021-12-11 DIAGNOSIS — M21612 Bunion of left foot: Secondary | ICD-10-CM | POA: Diagnosis not present

## 2021-12-11 HISTORY — DX: Bunion of right foot: M21.611

## 2021-12-11 HISTORY — DX: Other hammer toe(s) (acquired), right foot: M20.41

## 2021-12-11 HISTORY — DX: Acquired keratosis (keratoderma) palmaris et plantaris: L85.1

## 2021-12-30 DIAGNOSIS — N8110 Cystocele, unspecified: Secondary | ICD-10-CM | POA: Diagnosis not present

## 2021-12-30 DIAGNOSIS — N390 Urinary tract infection, site not specified: Secondary | ICD-10-CM | POA: Diagnosis not present

## 2022-01-22 DIAGNOSIS — N813 Complete uterovaginal prolapse: Secondary | ICD-10-CM | POA: Diagnosis not present

## 2022-01-22 DIAGNOSIS — N816 Rectocele: Secondary | ICD-10-CM | POA: Diagnosis not present

## 2022-01-22 DIAGNOSIS — N8111 Cystocele, midline: Secondary | ICD-10-CM | POA: Diagnosis not present

## 2022-02-13 DIAGNOSIS — R8290 Unspecified abnormal findings in urine: Secondary | ICD-10-CM | POA: Diagnosis not present

## 2022-02-13 DIAGNOSIS — R82998 Other abnormal findings in urine: Secondary | ICD-10-CM | POA: Diagnosis not present

## 2022-02-13 DIAGNOSIS — N398 Other specified disorders of urinary system: Secondary | ICD-10-CM | POA: Diagnosis not present

## 2022-02-13 DIAGNOSIS — N813 Complete uterovaginal prolapse: Secondary | ICD-10-CM | POA: Diagnosis not present

## 2022-02-13 DIAGNOSIS — B952 Enterococcus as the cause of diseases classified elsewhere: Secondary | ICD-10-CM | POA: Diagnosis not present

## 2022-02-21 DIAGNOSIS — I471 Supraventricular tachycardia: Secondary | ICD-10-CM | POA: Diagnosis not present

## 2022-02-21 DIAGNOSIS — Z01818 Encounter for other preprocedural examination: Secondary | ICD-10-CM | POA: Diagnosis not present

## 2022-02-21 DIAGNOSIS — R002 Palpitations: Secondary | ICD-10-CM | POA: Diagnosis not present

## 2022-02-25 DIAGNOSIS — Z01818 Encounter for other preprocedural examination: Secondary | ICD-10-CM | POA: Diagnosis not present

## 2022-02-25 DIAGNOSIS — I498 Other specified cardiac arrhythmias: Secondary | ICD-10-CM | POA: Diagnosis not present

## 2022-02-26 DIAGNOSIS — Z9181 History of falling: Secondary | ICD-10-CM | POA: Diagnosis not present

## 2022-02-26 DIAGNOSIS — Z1331 Encounter for screening for depression: Secondary | ICD-10-CM | POA: Diagnosis not present

## 2022-02-26 DIAGNOSIS — Z0000 Encounter for general adult medical examination without abnormal findings: Secondary | ICD-10-CM | POA: Diagnosis not present

## 2022-02-26 DIAGNOSIS — Z139 Encounter for screening, unspecified: Secondary | ICD-10-CM | POA: Diagnosis not present

## 2022-02-26 DIAGNOSIS — E785 Hyperlipidemia, unspecified: Secondary | ICD-10-CM | POA: Diagnosis not present

## 2022-02-27 DIAGNOSIS — N813 Complete uterovaginal prolapse: Secondary | ICD-10-CM | POA: Diagnosis not present

## 2022-02-27 DIAGNOSIS — K5904 Chronic idiopathic constipation: Secondary | ICD-10-CM | POA: Diagnosis not present

## 2022-02-27 DIAGNOSIS — N393 Stress incontinence (female) (male): Secondary | ICD-10-CM | POA: Diagnosis not present

## 2022-02-27 DIAGNOSIS — Z01818 Encounter for other preprocedural examination: Secondary | ICD-10-CM | POA: Diagnosis not present

## 2022-02-27 HISTORY — DX: Stress incontinence (female) (male): N39.3

## 2022-03-03 DIAGNOSIS — Z87440 Personal history of urinary (tract) infections: Secondary | ICD-10-CM | POA: Diagnosis not present

## 2022-03-03 DIAGNOSIS — K5909 Other constipation: Secondary | ICD-10-CM | POA: Diagnosis not present

## 2022-03-03 DIAGNOSIS — N83292 Other ovarian cyst, left side: Secondary | ICD-10-CM | POA: Diagnosis not present

## 2022-03-03 DIAGNOSIS — E039 Hypothyroidism, unspecified: Secondary | ICD-10-CM | POA: Diagnosis not present

## 2022-03-03 DIAGNOSIS — N83291 Other ovarian cyst, right side: Secondary | ICD-10-CM | POA: Diagnosis not present

## 2022-03-03 DIAGNOSIS — N393 Stress incontinence (female) (male): Secondary | ICD-10-CM | POA: Diagnosis not present

## 2022-03-03 DIAGNOSIS — N813 Complete uterovaginal prolapse: Secondary | ICD-10-CM | POA: Diagnosis not present

## 2022-03-03 DIAGNOSIS — D259 Leiomyoma of uterus, unspecified: Secondary | ICD-10-CM | POA: Diagnosis not present

## 2022-03-03 DIAGNOSIS — D261 Other benign neoplasm of corpus uteri: Secondary | ICD-10-CM | POA: Diagnosis not present

## 2022-03-03 DIAGNOSIS — B814 Mixed intestinal helminthiases: Secondary | ICD-10-CM | POA: Diagnosis not present

## 2022-03-03 DIAGNOSIS — N888 Other specified noninflammatory disorders of cervix uteri: Secondary | ICD-10-CM | POA: Diagnosis not present

## 2022-03-04 DIAGNOSIS — K5909 Other constipation: Secondary | ICD-10-CM | POA: Diagnosis not present

## 2022-03-04 DIAGNOSIS — E039 Hypothyroidism, unspecified: Secondary | ICD-10-CM | POA: Diagnosis not present

## 2022-03-04 DIAGNOSIS — N393 Stress incontinence (female) (male): Secondary | ICD-10-CM | POA: Diagnosis not present

## 2022-03-04 DIAGNOSIS — N813 Complete uterovaginal prolapse: Secondary | ICD-10-CM | POA: Diagnosis not present

## 2022-03-04 DIAGNOSIS — Z87440 Personal history of urinary (tract) infections: Secondary | ICD-10-CM | POA: Diagnosis not present

## 2022-03-04 DIAGNOSIS — D261 Other benign neoplasm of corpus uteri: Secondary | ICD-10-CM | POA: Diagnosis not present

## 2022-03-13 DIAGNOSIS — R399 Unspecified symptoms and signs involving the genitourinary system: Secondary | ICD-10-CM | POA: Diagnosis not present

## 2022-04-14 DIAGNOSIS — N393 Stress incontinence (female) (male): Secondary | ICD-10-CM | POA: Diagnosis not present

## 2022-04-14 DIAGNOSIS — N813 Complete uterovaginal prolapse: Secondary | ICD-10-CM | POA: Diagnosis not present

## 2022-05-16 DIAGNOSIS — I441 Atrioventricular block, second degree: Secondary | ICD-10-CM | POA: Diagnosis not present

## 2022-05-16 DIAGNOSIS — I998 Other disorder of circulatory system: Secondary | ICD-10-CM | POA: Diagnosis not present

## 2022-05-16 DIAGNOSIS — R531 Weakness: Secondary | ICD-10-CM | POA: Diagnosis not present

## 2022-05-16 DIAGNOSIS — D171 Benign lipomatous neoplasm of skin and subcutaneous tissue of trunk: Secondary | ICD-10-CM | POA: Diagnosis not present

## 2022-05-20 DIAGNOSIS — D171 Benign lipomatous neoplasm of skin and subcutaneous tissue of trunk: Secondary | ICD-10-CM | POA: Diagnosis not present

## 2022-05-22 DIAGNOSIS — M1990 Unspecified osteoarthritis, unspecified site: Secondary | ICD-10-CM | POA: Insufficient documentation

## 2022-05-22 DIAGNOSIS — T7840XA Allergy, unspecified, initial encounter: Secondary | ICD-10-CM | POA: Insufficient documentation

## 2022-06-10 NOTE — Progress Notes (Unsigned)
Cardiology Office Note:    Date:  06/11/2022   ID:  Robin Doyle, DOB 09-Sep-1950, MRN [id number]  PCP:  Nicoletta Dress, MD  Cardiologist:  Shirlee More, MD   Referring MD: Nicoletta Dress, MD  ASSESSMENT:    1. Dizzy spells   2. Paroxysmal atrial fibrillation (HCC)   3. Hypotension, unspecified hypotension type   4. Ectopic atrial tachycardia   5. Pre-procedure lab exam   6. Premature atrial contractions    PLAN:    In order of problems listed above:  Her husband captured an episode that lasted 2 or 3 minutes and currently sure it is rate controlled atrial fibrillation the episodes are very infrequent 1 or 2 times a month lasting only 1 to 2 minutes and I do not think that crossed the threshold to initiate anticoagulation with ongoing risk factor being age and female sex.  If rate is rapid she can take a dose of beta-blocker as needed.  She will continue to monitor it with her home mobile Kardia device No recent episodes of hypotension She has had episodes of chest pain with the episodes of weakness and also has exertional shortness of breath undergoing evaluation including cardiac CTA  Next appointment   Medication Adjustments/Labs and Tests Ordered: Current medicines are reviewed at length with the patient today.  Concerns regarding medicines are outlined above.  Orders Placed This Encounter  Procedures   Basic metabolic panel   EKG 15-QMGQ   Meds ordered this encounter  Medications   metoprolol tartrate (LOPRESSOR) 100 MG tablet    Sig: Take 1 tablet (100 mg total) by mouth once for 1 dose. Take 2 hours prior to CT    Dispense:  1 tablet    Refill:  0     Chief complaint I am having spells blood pressure is elevated, has been captured an episode that looks like atrial fibrillation Follow-up in 6 to 8 weeks  History of Present Illness:    Robin Doyle is a 71 y.o. female who is being seen today for the evaluation of labile blood pressure at the request of  Nicoletta Dress, MD. The episodes of weakness are associated with palpitation and she has a background history of atrial arrhythmia with APCs.  Previously seen by me most recently 02/12/2018 with atrial tachycardia suppressed with flecainide.  She has also been noted in the past to have intermittent hypotension.  Rarely once or twice a month she has episodes where she feels weak she does her blood pressure afterwards is elevated in the range of 170 she notices her heart beating erratically and her husband with the mobile cardiac captured episode recently lasting few minutes and looks like atrial fibrillation. She also has had exertional shortness of breath With her spells she gets chest tightness No edema orthopnea or syncope   She had an EKG 05/16/2022 showing atrial bigeminy  She had an echocardiogram June 2021 showing a structurally normal heart. 1. Left ventricular ejection fraction, by estimation, is 60 to 65%. The  left ventricle has normal function. The left ventricle has no regional  wall motion abnormalities. Left ventricular diastolic parameters were  normal.   2. Right ventricular systolic function is normal. The right ventricular  size is normal.   3. The mitral valve is normal in structure. No evidence of mitral valve  regurgitation. No evidence of mitral stenosis.   4. The aortic valve is normal in structure. Aortic valve regurgitation is  not visualized. Mild to moderate  aortic valve sclerosis/calcification is  present, without any evidence of aortic stenosis. Past Medical History:  Diagnosis Date   Allergy    Arrhythmia 02/05/2017   Arthritis    low back, posterior cervical, hips, shoulder   Back pain 02/05/2017   Colon polyps    hyperplastic, last study  May '09   Ectopic atrial tachycardia 06/24/2016   Heart palpitations    Hyperlipidemia LDL goal < 130 12/11/2010   Baseline LDL 160 June '12    Premature atrial contractions 06/24/2016   Routine general medical  examination at a health care facility 12/11/2010    Past Surgical History:  Procedure Laterality Date   APPENDECTOMY  '67   G2P2     PILONIDAL CYST EXCISION  '70   TONSILLECTOMY  '54    Current Medications: Current Meds  Medication Sig   Biotin 1000 MCG tablet Take 1,000 mcg by mouth daily.   Coenzyme Q10 (CO Q 10 PO) Take 1 capsule by mouth daily.   Cyanocobalamin (VITAMIN B 12 PO) Take 1,000 mcg by mouth daily.   Flaxseed, Linseed, (FLAXSEED OIL) 1000 MG CAPS Take 1,000 mg by mouth daily.   Magnesium 250 MG TABS Take 1 tablet by mouth daily.   metoprolol tartrate (LOPRESSOR) 100 MG tablet Take 1 tablet (100 mg total) by mouth once for 1 dose. Take 2 hours prior to CT   metoprolol tartrate (LOPRESSOR) 25 MG tablet Take 1 tablet (25 mg) daily as needed for heart rate greater than 130.   Multiple Vitamins-Minerals (ALIVE WOMENS 50+ PO) Take 1 tablet by mouth daily.   Omega-3 Fatty Acids (FISH OIL) 1000 MG CAPS Take 1,000 mg by mouth daily.   Quercetin 250 MG TABS Take 250 mg by mouth daily at 12 noon.     Allergies:   Fluconazole, Metoprolol, Flecainide, Codeine, Lidocaine, and Prednisone   Social History   Socioeconomic History   Marital status: Married    Spouse name: Not on file   Number of children: 2   Years of education: 14   Highest education level: Not on file  Occupational History   Occupation: Landscape architect  Tobacco Use   Smoking status: Never   Smokeless tobacco: Never  Vaping Use   Vaping Use: Never used  Substance and Sexual Activity   Alcohol use: No   Drug use: No   Sexual activity: Yes    Partners: Male  Other Topics Concern   Not on file  Social History Narrative   HSG, trade school - Investment banker, [employer]. Married '70. 2 sons - '73, '75; 1 adopted grandson, 1 grand-daughter. Work - keeps the farm and cattle. Marriage is in good health.    Social Determinants of Health   Financial Resource Strain: Not on file  Food Insecurity: Not on file   Transportation Needs: Not on file  Physical Activity: Not on file  Stress: Not on file  Social Connections: Not on file     Family History: The patient's family history includes Arrhythmia in her father; Arthritis in her father and mother; Atrial fibrillation in her mother; Cancer in her maternal aunt, maternal aunt, and paternal grandmother; Gout in her father and mother; Heart disease in her father and mother; Heart failure in her mother; Hyperlipidemia in her father; Hypertension in her mother; Mental retardation in her brother. There is no history of Diabetes.  ROS:   ROS Please see the history of present illness.     All other systems reviewed and are negative.  EKGs/Labs/Other Studies Reviewed:    The following studies were reviewed today:   EKG:  EKG is  ordered today.  The ekg ordered today is personally reviewed and demonstrates sinus rhythm with sinus arrhythmia otherwise normal EKG  Recent Labs: No results found for requested labs within last 365 days.  Recent Lipid Panel    Component Value Date/Time   CHOL 218 (H) 12/10/2010 0802   TRIG 51.0 12/10/2010 0802   HDL 44.80 12/10/2010 0802   CHOLHDL 5 12/10/2010 0802   VLDL 10.2 12/10/2010 0802   LDLDIRECT 160.1 12/10/2010 0802    Physical Exam:    VS:  BP 128/64 (BP Location: Right Arm, Patient Position: Sitting)   Pulse 68   Ht 5' 6.5" (1.689 m)   Wt 132 lb (59.9 kg)   SpO2 99%   BMI 20.99 kg/m     Wt Readings from Last 3 Encounters:  06/11/22 132 lb (59.9 kg)  11/23/18 135 lb (61.2 kg)  02/12/18 131 lb 3.2 oz (59.5 kg)     GEN:  Well nourished, well developed in no acute distress HEENT: Normal NECK: No JVD; No carotid bruits LYMPHATICS: No lymphadenopathy CARDIAC: RRR, no murmurs, rubs, gallops RESPIRATORY:  Clear to auscultation without rales, wheezing or rhonchi  ABDOMEN: Soft, non-tender, non-distended MUSCULOSKELETAL:  No edema; No deformity  SKIN: Warm and dry NEUROLOGIC:  Alert and oriented  x 3 PSYCHIATRIC:  Normal affect     Signed, Shirlee More, MD  06/11/2022 2:57 PM    [facility]

## 2022-06-11 ENCOUNTER — Encounter: Payer: Self-pay | Admitting: Cardiology

## 2022-06-11 ENCOUNTER — Telehealth: Payer: Self-pay | Admitting: Cardiology

## 2022-06-11 ENCOUNTER — Other Ambulatory Visit: Payer: Self-pay

## 2022-06-11 ENCOUNTER — Ambulatory Visit: Payer: Medicare Other | Attending: Cardiology | Admitting: Cardiology

## 2022-06-11 VITALS — BP 128/64 | HR 68 | Ht 66.5 in | Wt 132.0 lb

## 2022-06-11 DIAGNOSIS — I4719 Other supraventricular tachycardia: Secondary | ICD-10-CM

## 2022-06-11 DIAGNOSIS — Z01812 Encounter for preprocedural laboratory examination: Secondary | ICD-10-CM

## 2022-06-11 DIAGNOSIS — R072 Precordial pain: Secondary | ICD-10-CM

## 2022-06-11 DIAGNOSIS — I480 Paroxysmal atrial fibrillation: Secondary | ICD-10-CM

## 2022-06-11 DIAGNOSIS — I491 Atrial premature depolarization: Secondary | ICD-10-CM | POA: Diagnosis not present

## 2022-06-11 DIAGNOSIS — R42 Dizziness and giddiness: Secondary | ICD-10-CM | POA: Diagnosis not present

## 2022-06-11 DIAGNOSIS — I959 Hypotension, unspecified: Secondary | ICD-10-CM

## 2022-06-11 MED ORDER — METOPROLOL TARTRATE 100 MG PO TABS: 100.0000 mg | ORAL_TABLET | Freq: Once | ORAL | 0 refills | Status: DC

## 2022-06-11 NOTE — Patient Instructions (Addendum)
Medication Instructions:  Your physician recommends that you continue on your current medications as directed. Please refer to the Current Medication list given to you today.  *If you need a refill on your cardiac medications before your next appointment, please call your pharmacy*   Lab Work: Your physician recommends that you return for lab work in: Today for a BMP  If you have labs (blood work) drawn today and your tests are completely normal, you will receive your results only by: [place] (if you have MyChart) OR A paper copy in the mail If you have any lab test that is abnormal or we need to change your treatment, we will call you to review the results.   Testing/Procedures: Your physician has requested that you have cardiac CT. Cardiac computed tomography (CT) is a painless test that uses an x-ray machine to take clear, detailed pictures of your heart. For further information please visit [email or web address]. Please follow instruction sheet as given.    Your Cardiac CT will be scheduled at:   [facility] located off [facility] at the hospital.  Please arrive 30 minutes prior to your appointment time.  You can use the FREE valet parking offered at entrance to outpatient center (encouraged to control the heart rate for the test)   Please follow these instructions carefully (unless otherwise directed):   On the Night Before the Test: Be sure to Drink plenty of water. Do not consume any caffeinated/decaffeinated beverages or chocolate 12 hours prior to your test. Do not take any antihistamines 12 hours prior to your test.  On the Day of the Test: Drink plenty of water until 1 hour prior to the test. Do not eat any food 4 hours prior to the test. No smoking 4 hours prior to test. You may take your regular medications prior to the test.  Take metoprolol (Lopressor) two hours prior to test. FEMALES- please wear underwire-free bra if available,  avoid dresses & tight clothing. Wear plain shirt no beads, sparkles, rhinestones, metal or heavy embroidery.  After the Test: Drink plenty of water. After receiving IV contrast, you may experience a mild flushed feeling. This is normal. On occasion, you may experience a mild rash up to 24 hours after the test. This is not dangerous. If this occurs, you can take Benadryl 25 mg and increase your fluid intake. If you experience trouble breathing, this can be serious. If it is severe call 911 IMMEDIATELY. If it is mild, please call our office. If you take any of these medications: Glipizide/Metformin, Avandament, Glucavance, please do not take 48 hours after completing test unless otherwise instructed.  We will call to schedule your test 2-4 weeks out understanding that some insurance companies will need an authorization prior to the service being performed.      Follow-Up: At [facility], you and your health needs are our priority.  As part of our continuing mission to provide you with exceptional heart care, we have created designated Provider Care Teams.  These Care Teams include your primary Cardiologist (physician) and Advanced Practice Providers (APPs -  Physician Assistants and Nurse Practitioners) who all work together to provide you with the care you need, when you need it.  We recommend signing up for the patient portal called "MyChart".  Sign up information is provided on this After Visit Summary.  MyChart is used to connect with patients for Virtual Visits (Telemedicine).  Patients are able to view lab/test results, encounter notes, upcoming appointments,  etc.  Non-urgent messages can be sent to your provider as well.   To learn more about what you can do with MyChart, go to [email or web address].    Your next appointment:   8 week(s)  The format for your next appointment:   In Person  Provider:   Shirlee More, MD   Other Instructions Cardiac CT Angiogram A cardiac  CT angiogram is a procedure to look at the heart and the area around the heart. It may be done to help find the cause of chest pains or other symptoms of heart disease. During this procedure, a substance called contrast dye is injected into the blood vessels in the area to be checked. A large X-ray machine, called a CT scanner, then takes detailed pictures of the heart and the surrounding area. The procedure is also sometimes called a coronary CT angiogram, coronary artery scanning, or CTA. A cardiac CT angiogram allows the health care provider to see how well blood is flowing to and from the heart. The health care provider will be able to see if there are any problems, such as: Blockage or narrowing of the coronary arteries in the heart. Fluid around the heart. Signs of weakness or disease in the muscles, valves, and tissues of the heart. Tell a health care provider about: Any allergies you have. This is especially important if you have had a previous allergic reaction to contrast dye. All medicines you are taking, including vitamins, herbs, eye drops, creams, and over-the-counter medicines. Any blood disorders you have. Any surgeries you have had. Any medical conditions you have. Whether you are pregnant or may be pregnant. Any anxiety disorders, chronic pain, or other conditions you have that may increase your stress or prevent you from lying still. What are the risks? Generally, this is a safe procedure. However, problems may occur, including: Bleeding. Infection. Allergic reactions to medicines or dyes. Damage to other structures or organs. Kidney damage from the contrast dye that is used. Increased risk of cancer from radiation exposure. This risk is low. Talk with your health care provider about: The risks and benefits of testing. How you can receive the lowest dose of radiation. What happens before the procedure? Wear comfortable clothing and remove any jewelry, glasses, dentures, and  hearing aids. Follow instructions from your health care provider about eating and drinking. This may include: For 12 hours before the procedure -- avoid caffeine. This includes tea, coffee, soda, energy drinks, and diet pills. Drink plenty of water or other fluids that do not have caffeine in them. Being well hydrated can prevent complications. For 4-6 hours before the procedure -- stop eating and drinking. The contrast dye can cause nausea, but this is less likely if your stomach is empty. Ask your health care provider about changing or stopping your regular medicines. This is especially important if you are taking diabetes medicines, blood thinners, or medicines to treat problems with erections (erectile dysfunction). What happens during the procedure?  Hair on your chest may need to be removed so that small sticky patches called electrodes can be placed on your chest. These will transmit information that helps to monitor your heart during the procedure. An IV will be inserted into one of your veins. You might be given a medicine to control your heart rate during the procedure. This will help to ensure that good images are obtained. You will be asked to lie on an exam table. This table will slide in and out of the CT machine during  the procedure. Contrast dye will be injected into the IV. You might feel warm, or you may get a metallic taste in your mouth. You will be given a medicine called nitroglycerin. This will relax or dilate the arteries in your heart. The table that you are lying on will move into the CT machine tunnel for the scan. The person running the machine will give you instructions while the scans are being done. You may be asked to: Keep your arms above your head. Hold your breath. Stay very still, even if the table is moving. When the scanning is complete, you will be moved out of the machine. The IV will be removed. The procedure may vary among health care providers and  hospitals. What can I expect after the procedure? After your procedure, it is common to have: A metallic taste in your mouth from the contrast dye. A feeling of warmth. A headache from the nitroglycerin. Follow these instructions at home: Take over-the-counter and prescription medicines only as told by your health care provider. If you are told, drink enough fluid to keep your urine pale yellow. This will help to flush the contrast dye out of your body. Most people can return to their normal activities right after the procedure. Ask your health care provider what activities are safe for you. It is up to you to get the results of your procedure. Ask your health care provider, or the department that is doing the procedure, when your results will be ready. Keep all follow-up visits as told by your health care provider. This is important. Contact a health care provider if: You have any symptoms of allergy to the contrast dye. These include: Shortness of breath. Rash or hives. A racing heartbeat. Summary A cardiac CT angiogram is a procedure to look at the heart and the area around the heart. It may be done to help find the cause of chest pains or other symptoms of heart disease. During this procedure, a large X-ray machine, called a CT scanner, takes detailed pictures of the heart and the surrounding area after a contrast dye has been injected into blood vessels in the area. Ask your health care provider about changing or stopping your regular medicines before the procedure. This is especially important if you are taking diabetes medicines, blood thinners, or medicines to treat erectile dysfunction. If you are told, drink enough fluid to keep your urine pale yellow. This will help to flush the contrast dye out of your body. This information is not intended to replace advice given to you by your health care provider. Make sure you discuss any questions you have with your health care  provider. Document Revised: 09/26/2021 Document Reviewed: 02/02/2019 Elsevier Patient Education  [place]

## 2022-06-11 NOTE — Telephone Encounter (Signed)
Pt c/o medication issue:  1. Name of Medication: metoprolol tartrate (LOPRESSOR) 100 MG tablet   2. How are you currently taking this medication (dosage and times per day)? Take 1 tablet (100 mg total) by mouth once for 1 dose. Take 2 hours prior to CT   3. Are you having a reaction (difficulty breathing--STAT)? no  4. What is your medication issue? Patient stated her medication should have been sent to the [facility] in [place], [place].  Please send to [facility]

## 2022-06-12 ENCOUNTER — Telehealth: Payer: Self-pay

## 2022-06-12 ENCOUNTER — Telehealth: Payer: Self-pay | Admitting: *Deleted

## 2022-06-12 LAB — BASIC METABOLIC PANEL
BUN/Creatinine Ratio: 22 (ref 12–28)
BUN: 21 mg/dL (ref 8–27)
CO2: 27 mmol/L (ref 20–29)
Calcium: 9.9 mg/dL (ref 8.7–10.3)
Chloride: 106 mmol/L (ref 96–106)
Creatinine, Ser: 0.97 mg/dL (ref 0.57–1.00)
Glucose: 101 mg/dL — ABNORMAL HIGH (ref 70–99)
Potassium: 4 mmol/L (ref 3.5–5.2)
Sodium: 143 mmol/L (ref 134–144)
eGFR: 62 mL/min/{1.73_m2} (ref >59)

## 2022-06-12 NOTE — Telephone Encounter (Signed)
Faxed labs and Dr. Oren Binet message [phone]

## 2022-06-12 NOTE — Telephone Encounter (Signed)
Faxed documents to [facility] for pt to have Cardiac CT scheduled.

## 2022-06-12 NOTE — Telephone Encounter (Signed)
LVM with normal results. Per Dr. Joya Gaskins note. Routed to PCP.

## 2022-06-12 NOTE — Telephone Encounter (Signed)
Robin Doyle from [place] called. Pt is scheduled for CT on 06-19-22. Her records stated she is allergic to Metoprolol but pt says she is not. She was ordered Metoprolol for the scan. Robin Doyle wants to make sure she is ok to take metoprolol.

## 2022-06-19 DIAGNOSIS — H25813 Combined forms of age-related cataract, bilateral: Secondary | ICD-10-CM | POA: Diagnosis not present

## 2022-06-19 DIAGNOSIS — R072 Precordial pain: Secondary | ICD-10-CM | POA: Diagnosis not present

## 2022-06-19 DIAGNOSIS — H524 Presbyopia: Secondary | ICD-10-CM | POA: Diagnosis not present

## 2022-06-19 DIAGNOSIS — Z83518 Family history of other specified eye disorder: Secondary | ICD-10-CM | POA: Diagnosis not present

## 2022-06-24 ENCOUNTER — Encounter: Payer: Self-pay | Admitting: Cardiology

## 2022-07-03 ENCOUNTER — Ambulatory Visit: Payer: Medicare Other | Admitting: Cardiology

## 2022-08-05 NOTE — Progress Notes (Unsigned)
Cardiology Office Note:    Date:  08/06/2022   ID:  Robin Doyle, DOB 11/13/1950, MRN [id number]  PCP:  Nicoletta Dress, MD  Cardiologist:  Shirlee More, MD    Referring MD: Nicoletta Dress, MD    ASSESSMENT:    1. Paroxysmal atrial fibrillation (HCC)   2. Labile blood pressure   3. Ectopic atrial tachycardia    PLAN:    In order of problems listed above:  She is having frequent episodes 3 since January 1 and all brief and concerned about potentially being caught in fibrillation for prolonged periods of time and the risk of stroke this time we will not except an anticoagulant not interested in antiarrhythmic drugs and would like to meet with Dr. Kathee Delton to discuss EP catheter ablation.  Consultation entered. We reviewed the results of her cardiac CTA would not advocate lipid-lowering therapy if the score is 0   Next appointment: As needed after EP   Medication Adjustments/Labs and Tests Ordered: Current medicines are reviewed at length with the patient today.  Concerns regarding medicines are outlined above.  No orders of the defined types were placed in this encounter.  No orders of the defined types were placed in this encounter.   Chief complaint follow-up atrial fibrillation after cardiac CTA   History of Present Illness:    Robin Doyle is a 72 y.o. female with a hx of labile blood pressure weakness and a history of atrial arrhythmia with symptomatic APCs and atrial tachycardia last seen 06/11/2022.  Her consumer device captured an episode of brief atrial fibrillation I reviewed last visit but decided not reached the point of initiating anticoagulant therapy.  CHADS2 score 2  Compliance with diet, lifestyle and medications: Yes  Her husband is present participates in evaluation Reviewed the mobile Jodelle Red and she has had 3 episodes of atrial fibrillation since January 1 I advocated to take  anticoagulant she declines Previously intolerant of flecainide  does not want to take antiarrhythmic drugs Dr. Crissie Sickles did an ablation on her mother and she would like to make Discussed intervention. She has a prescription for beta-blocker to take for rapid heart rate Fortunately all episodes have been brief less than 5 to 10 minutes  Following her visit she had a cardiac CTA performed 06/19/2022 showing a calcium score of 0 normal coronary arteries normal thoracic aorta and pulmonary artery and over read of the chest CT normal by radiology Past Medical History:  Diagnosis Date   Allergy    Arrhythmia 02/05/2017   Arthritis    low back, posterior cervical, hips, shoulder   Back pain 02/05/2017   Colon polyps    hyperplastic, last study  May '09   Ectopic atrial tachycardia 06/24/2016   Heart palpitations    Hyperlipidemia LDL goal < 130 12/11/2010   Baseline LDL 160 June '12    Premature atrial contractions 06/24/2016   Routine general medical examination at a health care facility 12/11/2010    Past Surgical History:  Procedure Laterality Date   APPENDECTOMY  '67   G2P2     PILONIDAL CYST EXCISION  '70   TONSILLECTOMY  '54    Current Medications: Current Meds  Medication Sig   Biotin 1000 MCG tablet Take 1,000 mcg by mouth daily.   Coenzyme Q10 (CO Q 10 PO) Take 1 capsule by mouth daily.   Cyanocobalamin (VITAMIN B 12 PO) Take 1,000 mcg by mouth daily.   Flaxseed, Linseed, (FLAXSEED OIL) 1000 MG CAPS Take  1,000 mg by mouth daily.   Magnesium 250 MG TABS Take 1 tablet by mouth daily.   Multiple Vitamins-Minerals (ALIVE WOMENS 50+ PO) Take 1 tablet by mouth daily.   Omega-3 Fatty Acids (FISH OIL) 1000 MG CAPS Take 1,000 mg by mouth daily.   Quercetin 250 MG TABS Take 250 mg by mouth daily at 12 noon.   TURMERIC PO Take 250 mg by mouth daily.     Allergies:   Fluconazole, Flecainide, Codeine, Lidocaine, and Prednisone   Social History   Socioeconomic History   Marital status: Married    Spouse name: Not on file   Number of children:  2   Years of education: 14   Highest education level: Not on file  Occupational History   Occupation: Landscape architect  Tobacco Use   Smoking status: Never   Smokeless tobacco: Never  Vaping Use   Vaping Use: Never used  Substance and Sexual Activity   Alcohol use: No   Drug use: No   Sexual activity: Yes    Partners: Male  Other Topics Concern   Not on file  Social History Narrative   HSG, trade school - Investment banker, corporate. Married '70. 2 sons - '73, '75; 1 adopted grandson, 1 grand-daughter. Work - keeps the farm and cattle. Marriage is in good health.    Social Determinants of Health   Financial Resource Strain: Not on file  Food Insecurity: Not on file  Transportation Needs: Not on file  Physical Activity: Not on file  Stress: Not on file  Social Connections: Not on file     Family History: The patient's family history includes Arrhythmia in her father; Arthritis in her father and mother; Atrial fibrillation in her mother; Cancer in her maternal aunt, maternal aunt, and paternal grandmother; Gout in her father and mother; Heart disease in her father and mother; Heart failure in her mother; Hyperlipidemia in her father; Hypertension in her mother; Mental retardation in her brother. There is no history of Diabetes. ROS:   Please see the history of present illness.    All other systems reviewed and are negative.  EKGs/Labs/Other Studies Reviewed:    The following studies were reviewed today:   Recent Labs: 06/11/2022: BUN 21; Creatinine, Ser 0.97; Potassium 4.0; Sodium 143  Recent Lipid Panel    Component Value Date/Time   CHOL 218 (H) 12/10/2010 0802   TRIG 51.0 12/10/2010 0802   HDL 44.80 12/10/2010 0802   CHOLHDL 5 12/10/2010 0802   VLDL 10.2 12/10/2010 0802   LDLDIRECT 160.1 12/10/2010 0802    Physical Exam:    VS:  BP 128/72   Pulse 72   Ht 5' 6.5" (1.689 m)   Wt 134 lb (60.8 kg)   SpO2 98%   BMI 21.30 kg/m     Wt Readings from Last 3 Encounters:   08/06/22 134 lb (60.8 kg)  06/11/22 132 lb (59.9 kg)  11/23/18 135 lb (61.2 kg)     GEN:  Well nourished, well developed in no acute distress HEENT: Normal NECK: No JVD; No carotid bruits LYMPHATICS: No lymphadenopathy CARDIAC: RRR, no murmurs, rubs, gallops RESPIRATORY:  Clear to auscultation without rales, wheezing or rhonchi  ABDOMEN: Soft, non-tender, non-distended MUSCULOSKELETAL:  No edema; No deformity  SKIN: Warm and dry NEUROLOGIC:  Alert and oriented x 3 PSYCHIATRIC:  Normal affect   Seen with Janan Halter RN chaperone   Signed, Shirlee More, MD  08/06/2022 1:45 PM    [facility]

## 2022-08-06 ENCOUNTER — Encounter: Payer: Self-pay | Admitting: Cardiology

## 2022-08-06 ENCOUNTER — Ambulatory Visit: Payer: Medicare Other | Attending: Cardiology | Admitting: Cardiology

## 2022-08-06 VITALS — BP 128/72 | HR 72 | Ht 66.5 in | Wt 134.0 lb

## 2022-08-06 DIAGNOSIS — I4719 Other supraventricular tachycardia: Secondary | ICD-10-CM | POA: Diagnosis not present

## 2022-08-06 DIAGNOSIS — R0989 Other specified symptoms and signs involving the circulatory and respiratory systems: Secondary | ICD-10-CM | POA: Diagnosis not present

## 2022-08-06 DIAGNOSIS — I480 Paroxysmal atrial fibrillation: Secondary | ICD-10-CM | POA: Diagnosis not present

## 2022-08-06 NOTE — Patient Instructions (Addendum)
Medication Instructions:  Your physician recommends that you continue on your current medications as directed. Please refer to the Current Medication list given to you today.  *If you need a refill on your cardiac medications before your next appointment, please call your pharmacy*   Lab Work: None If you have labs (blood work) drawn today and your tests are completely normal, you will receive your results only by: [place] (if you have MyChart) OR A paper copy in the mail If you have any lab test that is abnormal or we need to change your treatment, we will call you to review the results.   Testing/Procedures: None   Follow-Up: At [facility], you and your health needs are our priority.  As part of our continuing mission to provide you with exceptional heart care, we have created designated Provider Care Teams.  These Care Teams include your primary Cardiologist (physician) and Advanced Practice Providers (APPs -  Physician Assistants and Nurse Practitioners) who all work together to provide you with the care you need, when you need it.  We recommend signing up for the patient portal called "MyChart".  Sign up information is provided on this After Visit Summary.  MyChart is used to connect with patients for Virtual Visits (Telemedicine).  Patients are able to view lab/test results, encounter notes, upcoming appointments, etc.  Non-urgent messages can be sent to your provider as well.   To learn more about what you can do with MyChart, go to [email or web address].    Your next appointment:   6 month(s)  Provider:   Shirlee More, MD    Other Instructions None  This visit was accompanied by Janan Halter.    1. Avoid all over-the-counter antihistamines except Claritin/Loratadine and Zyrtec/Cetrizine. 2. Avoid all combination including cold sinus allergies flu decongestant and sleep medications 3. You can use Robitussin DM Mucinex and Mucinex DM for cough. 4.  can use Tylenol aspirin ibuprofen and naproxen but no combinations such as sleep or sinus.

## 2022-08-12 ENCOUNTER — Ambulatory Visit (INDEPENDENT_AMBULATORY_CARE_PROVIDER_SITE_OTHER): Payer: Medicare Other

## 2022-08-12 ENCOUNTER — Ambulatory Visit: Payer: Medicare Other | Attending: Internal Medicine | Admitting: Internal Medicine

## 2022-08-12 ENCOUNTER — Encounter: Payer: Self-pay | Admitting: Internal Medicine

## 2022-08-12 VITALS — BP 126/72 | HR 64 | Ht 66.5 in | Wt 131.2 lb

## 2022-08-12 DIAGNOSIS — I480 Paroxysmal atrial fibrillation: Secondary | ICD-10-CM

## 2022-08-12 DIAGNOSIS — I491 Atrial premature depolarization: Secondary | ICD-10-CM | POA: Diagnosis not present

## 2022-08-12 HISTORY — DX: Paroxysmal atrial fibrillation: I48.0

## 2022-08-12 NOTE — Patient Instructions (Addendum)
Medication Instructions:  Your physician recommends that you continue on your current medications as directed. Please refer to the Current Medication list given to you today.  *If you need a refill on your cardiac medications before your next appointment, please call your pharmacy*  Lab Work: None ordered.  If you have labs (blood work) drawn today and your tests are completely normal, you will receive your results only by: [place] (if you have MyChart) OR A paper copy in the mail If you have any lab test that is abnormal or we need to change your treatment, we will call you to review the results.  Testing/Procedures:  Dr. Cristopher Peru has ordered a 14 Day home Zio Cardiac Monitor;  You will receive this by mail, and follow up with Dr. Lovena Le in 6 weeks from today.    Follow-Up: At [facility], you and your health needs are our priority.  As part of our continuing mission to provide you with exceptional heart care, we have created designated Provider Care Teams.  These Care Teams include your primary Cardiologist (physician) and Advanced Practice Providers (APPs -  Physician Assistants and Nurse Practitioners) who all work together to provide you with the care you need, when you need it.  We recommend signing up for the patient portal called "MyChart".  Sign up information is provided on this After Visit Summary.  MyChart is used to connect with patients for Virtual Visits (Telemedicine).  Patients are able to view lab/test results, encounter notes, upcoming appointments, etc.  Non-urgent messages can be sent to your provider as well.   To learn more about what you can do with MyChart, go to [email or web address].    Your next appointment:   You will follow up with Dr. Cristopher Peru in 6 weeks.    The format for your next appointment:   In Person  Provider:   Cristopher Peru, MD{or one of the following Advanced Practice Providers on your designated Care Team:   Tommye Standard,  Vermont Legrand Como "Jonni Sanger" Tillery, PA-C   [place] Monitor Instructions  Your physician has requested you wear a ZIO patch monitor for 14 days.  This is a single patch monitor. [employer] supplies one patch monitor per enrollment. Additional stickers are not available. Please do not apply patch if you will be having a Nuclear Stress Test,  Echocardiogram, Cardiac CT, MRI, or Chest Xray during the period you would be wearing the  monitor. The patch cannot be worn during these tests. You cannot remove and re-apply the  ZIO XT patch monitor.  Your ZIO patch monitor will be mailed 3 day USPS to your address on file. It may take 3-5 days  to receive your monitor after you have been enrolled.  Once you have received your monitor, please review the enclosed instructions. Your monitor  has already been registered assigning a specific monitor serial # to you.  Billing and Patient Assistance Program Information  We have supplied [employer] with any of your insurance information on file for billing purposes. [employer] offers a sliding scale Patient Assistance Program for patients that do not have  insurance, or whose insurance does not completely cover the cost of the ZIO monitor.  You must apply for the Patient Assistance Program to qualify for this discounted rate.  To apply, please call [employer] at [phone], select option 4, select option 2, ask to apply for  Patient Assistance Program. Theodore Demark will ask your household income, and how many people  are in  your household. They will quote your out-of-pocket cost based on that information.  [employer] will also be able to set up a 61-month interest-free payment plan if needed.  Applying the monitor   Shave hair from upper left chest.  Hold abrader disc by orange tab. Rub abrader in 40 strokes over the upper left chest as  indicated in your monitor instructions.  Clean area with 4 enclosed alcohol pads. Let dry.  Apply patch as indicated in monitor  instructions. Patch will be placed under collarbone on left  side of chest with arrow pointing upward.  Rub patch adhesive wings for 2 minutes. Remove white label marked "1". Remove the white  label marked "2". Rub patch adhesive wings for 2 additional minutes.  While looking in a mirror, press and release button in center of patch. A small green light will  flash 3-4 times. This will be your only indicator that the monitor has been turned on.  Do not shower for the first 24 hours. You may shower after the first 24 hours.  Press the button if you feel a symptom. You will hear a small click. Record Date, Time and  Symptom in the Patient Logbook.  When you are ready to remove the patch, follow instructions on the last 2 pages of Patient  Logbook. Stick patch monitor onto the last page of Patient Logbook.  Place Patient Logbook in the blue and white box. Use locking tab on box and tape box closed  securely. The blue and white box has prepaid postage on it. Please place it in the mailbox as  soon as possible. Your physician should have your test results approximately 7 days after the  monitor [facility]  Call [place]at [phone] you have questions regarding  your ZIO XT patch monitor. Call them immediately if you see an orange light blinking on your  monitor.  If your monitor falls off in less than 4 days, contact our Monitor department at [phone]  If your monitor becomes loose or falls off after 4 days call [employer] at [phone]  suggestions on securing your monitor

## 2022-08-12 NOTE — Progress Notes (Unsigned)
Enrolled for [employer] to mail a ZIO XT long term holter monitor to the patients address on file.  

## 2022-08-12 NOTE — Progress Notes (Signed)
HPI Robin Doyle is referred by Dr. Bettina Gavia for evaluation of PAF. She is a pleasant 72 yo woman with a h/o borderline HTN, who has seen Dr. Ola Spurr in the past. The patient has taken flecainide in the past. She is said to have an "allergy". I have asked her about her symptoms and it is unclear to me as to whether she has an allergy. I do not have any documentation of her atrial fib. She has not had syncope. She is not on systemic anti-coagulation.  Allergies  Allergen Reactions   Fluconazole Swelling    Ulcers and lip swelling  Other Reaction(s): other   Flecainide Other (See Comments)    "trembling, weakness, and blurred vision"   Codeine Nausea And Vomiting   Lidocaine Other (See Comments)    Mouth ulcers, lip swelling   Prednisone Other (See Comments) and Palpitations    "made me hyper"  Other Reaction(s): irregular heart rate, Other (See Comments)     Current Outpatient Medications  Medication Sig Dispense Refill   Biotin 1000 MCG tablet Take 1,000 mcg by mouth daily.     Coenzyme Q10 (CO Q 10 PO) Take 1 capsule by mouth daily.     Cyanocobalamin (VITAMIN B 12 PO) Take 1,000 mcg by mouth daily.     Flaxseed, Linseed, (FLAXSEED OIL) 1000 MG CAPS Take 1,000 mg by mouth daily.     Magnesium 250 MG TABS Take 1 tablet by mouth daily.     metoprolol tartrate (LOPRESSOR) 25 MG tablet Take 1 tablet (25 mg) daily as needed for heart rate greater than 130. 30 tablet 3   Multiple Vitamins-Minerals (ALIVE WOMENS 50+ PO) Take 1 tablet by mouth daily.     Omega-3 Fatty Acids (FISH OIL) 1000 MG CAPS Take 1,000 mg by mouth daily.     Quercetin 250 MG TABS Take 250 mg by mouth daily at 12 noon.     TURMERIC PO Take 250 mg by mouth daily.     No current facility-administered medications for this visit.     Past Medical History:  Diagnosis Date   Allergy    Arrhythmia 02/05/2017   Arthritis    low back, posterior cervical, hips, shoulder   Back pain 02/05/2017   Colon polyps     hyperplastic, last study  May '09   Ectopic atrial tachycardia 06/24/2016   Heart palpitations    Hyperlipidemia LDL goal < 130 12/11/2010   Baseline LDL 160 June '12    Premature atrial contractions 06/24/2016   Routine general medical examination at a health care facility 12/11/2010    ROS:   All systems reviewed and negative except as noted in the HPI.   Past Surgical History:  Procedure Laterality Date   APPENDECTOMY  '67   G2P2     PILONIDAL CYST EXCISION  '70   TONSILLECTOMY  '54     Family History  Problem Relation Age of Onset   Arthritis Mother    Gout Mother    Hypertension Mother    Heart disease Mother        a. fib; pacemaker   Atrial fibrillation Mother    Heart failure Mother    Heart disease Father        CAD/MI; Mitral valve disease, a. fib   Gout Father    Hyperlipidemia Father    Arthritis Father        DDD, had surgrery with post-op pain.   Arrhythmia Father  Mental retardation Brother        changed due to closed head injury 3 wheeler accident   Cancer Maternal Aunt        breast cancer   Cancer Paternal Grandmother        colon   Cancer Maternal Aunt        breast cancer   Diabetes Neg Hx      Social History   Socioeconomic History   Marital status: Married    Spouse name: Not on file   Number of children: 2   Years of education: 14   Highest education level: Not on file  Occupational History   Occupation: Landscape architect  Tobacco Use   Smoking status: Never   Smokeless tobacco: Never  Vaping Use   Vaping Use: Never used  Substance and Sexual Activity   Alcohol use: No   Drug use: No   Sexual activity: Yes    Partners: Male  Other Topics Concern   Not on file  Social History Narrative   HSG, trade school - Investment banker, corporate. Married '70. 2 sons - '73, '75; 1 adopted grandson, 1 grand-daughter. Work - keeps the farm and cattle. Marriage is in good health.    Social Determinants of Health   Financial Resource Strain: Not  on file  Food Insecurity: Not on file  Transportation Needs: Not on file  Physical Activity: Not on file  Stress: Not on file  Social Connections: Not on file  Intimate Partner Violence: Not on file     BP 126/72   Pulse 64   Ht 5' 6.5" (1.689 m)   Wt 131 lb 3.2 oz (59.5 kg)   SpO2 98%   BMI 20.86 kg/m   Physical Exam:  Well appearing NAD HEENT: Unremarkable Neck:  No JVD, no thyromegally Lymphatics:  No adenopathy Back:  No CVA tenderness Lungs:  Clear with no wheezes HEART:  Regular rate rhythm, no murmurs, no rubs, no clicks Abd:  soft, positive bowel sounds, no organomegally, no rebound, no guarding Ext:  2 plus pulses, no edema, no cyanosis, no clubbing Skin:  No rashes no nodules Neuro:  CN II through XII intact, motor grossly intact  EKG - nsr  Assess/Plan: Possible PAF - I would like more detail. I have asked her to wear a 14 day Zio monitor.  Coags - she is not on systemic anti-coagulation. She may well need to be. We will assess after her zio monitor.  Carleene Overlie Jaylyn Iyer,MD

## 2022-08-22 DIAGNOSIS — K5900 Constipation, unspecified: Secondary | ICD-10-CM | POA: Diagnosis not present

## 2022-08-22 DIAGNOSIS — Z9889 Other specified postprocedural states: Secondary | ICD-10-CM | POA: Diagnosis not present

## 2022-08-22 DIAGNOSIS — R3912 Poor urinary stream: Secondary | ICD-10-CM | POA: Diagnosis not present

## 2022-09-05 DIAGNOSIS — I491 Atrial premature depolarization: Secondary | ICD-10-CM | POA: Diagnosis not present

## 2022-09-05 DIAGNOSIS — W19XXXA Unspecified fall, initial encounter: Secondary | ICD-10-CM | POA: Diagnosis not present

## 2022-09-05 DIAGNOSIS — S300XXA Contusion of lower back and pelvis, initial encounter: Secondary | ICD-10-CM | POA: Diagnosis not present

## 2022-09-05 DIAGNOSIS — I480 Paroxysmal atrial fibrillation: Secondary | ICD-10-CM | POA: Diagnosis not present

## 2022-09-08 ENCOUNTER — Telehealth: Payer: Self-pay

## 2022-09-08 NOTE — Telephone Encounter (Signed)
-----   Message from Evans Lance, MD sent at 09/06/2022 11:13 PM EDT ----- Monitor is reassuring.

## 2022-09-23 ENCOUNTER — Ambulatory Visit: Payer: Medicare Other | Attending: Internal Medicine | Admitting: Internal Medicine

## 2022-09-23 ENCOUNTER — Encounter: Payer: Self-pay | Admitting: Internal Medicine

## 2022-09-23 VITALS — BP 110/58 | HR 77 | Ht 66.5 in | Wt 133.4 lb

## 2022-09-23 DIAGNOSIS — I480 Paroxysmal atrial fibrillation: Secondary | ICD-10-CM | POA: Diagnosis not present

## 2022-09-23 DIAGNOSIS — I4719 Other supraventricular tachycardia: Secondary | ICD-10-CM | POA: Diagnosis not present

## 2022-09-23 NOTE — Patient Instructions (Addendum)
Medication Instructions:  Your physician recommends that you continue on your current medications as directed. Please refer to the Current Medication list given to you today.  *If you need a refill on your cardiac medications before your next appointment, please call your pharmacy*  Lab Work: None ordered.  If you have labs (blood work) drawn today and your tests are completely normal, you will receive your results only by: MyChart Message (if you have MyChart) OR A paper copy in the mail If you have any lab test that is abnormal or we need to change your treatment, we will call you to review the results.  Testing/Procedures: None ordered.  Follow-Up: At [facility], you and your health needs are our priority.  As part of our continuing mission to provide you with exceptional heart care, we have created designated Provider Care Teams.  These Care Teams include your primary Cardiologist (physician) and Advanced Practice Providers (APPs -  Physician Assistants and Nurse Practitioners) who all work together to provide you with the care you need, when you need it.  We recommend signing up for the patient portal called "MyChart".  Sign up information is provided on this After Visit Summary.  MyChart is used to connect with patients for Virtual Visits (Telemedicine).  Patients are able to view lab/test results, encounter notes, upcoming appointments, etc.  Non-urgent messages can be sent to your provider as well.   To learn more about what you can do with MyChart, go to [email or web address].    Your next appointment:   AS NEEDED  The format for your next appointment:   In Person  Provider:   Gregg Taylor, MD{or one of the following Advanced Practice Providers on your designated Care Team:   Renee Ursuy, PA-C Michael "Andy" Tillery, PA-C          

## 2022-09-23 NOTE — Progress Notes (Signed)
HPI Ms. Robin Doyle returns for evaluation of PAF. She is a pleasant 72 yo woman with a h/o borderline HTN, who has seen Dr. Ola Spurr in the past. The patient has taken flecainide in the past. She is said to have an "allergy". I have asked her about her symptoms and it is unclear to me as to whether she has an allergy. I do not have any documentation of her atrial fib. She has not had syncope. She is not on systemic anti-coagulation. She wore a cardiac monitor and had NS AT. No atrial fib. She has started back working in her garden.  Allergies  Allergen Reactions   Fluconazole Swelling    Ulcers and lip swelling  Other Reaction(s): other   Flecainide Other (See Comments)    "trembling, weakness, and blurred vision"   Codeine Nausea And Vomiting   Lidocaine Other (See Comments)    Mouth ulcers, lip swelling   Prednisone Other (See Comments) and Palpitations    "made me hyper"  Other Reaction(s): irregular heart rate, Other (See Comments)     Current Outpatient Medications  Medication Sig Dispense Refill   Biotin 1000 MCG tablet Take 1,000 mcg by mouth daily.     Coenzyme Q10 (CO Q 10 PO) Take 1 capsule by mouth daily.     Cyanocobalamin (VITAMIN B 12 PO) Take 1,000 mcg by mouth daily.     Flaxseed, Linseed, (FLAXSEED OIL) 1000 MG CAPS Take 1,000 mg by mouth daily.     Magnesium 250 MG TABS Take 1 tablet by mouth daily.     metoprolol tartrate (LOPRESSOR) 25 MG tablet Take 1 tablet (25 mg) daily as needed for heart rate greater than 130. 30 tablet 3   Multiple Vitamins-Minerals (ALIVE WOMENS 50+ PO) Take 1 tablet by mouth daily.     Omega-3 Fatty Acids (FISH OIL) 1000 MG CAPS Take 1,000 mg by mouth daily.     Quercetin 250 MG TABS Take 250 mg by mouth daily at 12 noon.     TURMERIC PO Take 250 mg by mouth daily.     No current facility-administered medications for this visit.     Past Medical History:  Diagnosis Date   Allergy    Arrhythmia 02/05/2017   Arthritis    low  back, posterior cervical, hips, shoulder   Back pain 02/05/2017   Colon polyps    hyperplastic, last study  May '09   Ectopic atrial tachycardia 06/24/2016   Heart palpitations    Hyperlipidemia LDL goal < 130 12/11/2010   Baseline LDL 160 June '12    Premature atrial contractions 06/24/2016   Routine general medical examination at a health care facility 12/11/2010    ROS:   All systems reviewed and negative except as noted in the HPI.   Past Surgical History:  Procedure Laterality Date   APPENDECTOMY  '67   G2P2     PILONIDAL CYST EXCISION  '70   TONSILLECTOMY  '54     Family History  Problem Relation Age of Onset   Arthritis Mother    Gout Mother    Hypertension Mother    Heart disease Mother        a. fib; pacemaker   Atrial fibrillation Mother    Heart failure Mother    Heart disease Father        CAD/MI; Mitral valve disease, a. fib   Gout Father    Hyperlipidemia Father    Arthritis Father  DDD, had surgrery with post-op pain.   Arrhythmia Father    Mental retardation Brother        changed due to closed head injury 3 wheeler accident   Cancer Maternal Aunt        breast cancer   Cancer Paternal Grandmother        colon   Cancer Maternal Aunt        breast cancer   Diabetes Neg Hx      Social History   Socioeconomic History   Marital status: Married    Spouse name: Not on file   Number of children: 2   Years of education: 14   Highest education level: Not on file  Occupational History   Occupation: Landscape architect  Tobacco Use   Smoking status: Never   Smokeless tobacco: Never  Vaping Use   Vaping Use: Never used  Substance and Sexual Activity   Alcohol use: No   Drug use: No   Sexual activity: Yes    Partners: Male  Other Topics Concern   Not on file  Social History Narrative   HSG, trade school - Investment banker, corporate. Married '70. 2 sons - '73, '75; 1 adopted grandson, 1 grand-daughter. Work - keeps the farm and cattle. Marriage is in  good health.    Social Determinants of Health   Financial Resource Strain: Not on file  Food Insecurity: Not on file  Transportation Needs: Not on file  Physical Activity: Not on file  Stress: Not on file  Social Connections: Not on file  Intimate Partner Violence: Not on file     BP (!) 110/58   Pulse 77   Ht 5' 6.5" (1.689 m)   Wt 133 lb 6.4 oz (60.5 kg)   SpO2 98%   BMI 21.21 kg/m   Physical Exam:  Well appearing NAD HEENT: Unremarkable Neck:  No JVD, no thyromegally Lymphatics:  No adenopathy Back:  No CVA tenderness Lungs:  Clear HEART:  Regular rate rhythm, no murmurs, no rubs, no clicks Abd:  soft, positive bowel sounds, no organomegally, no rebound, no guarding Ext:  2 plus pulses, no edema, no cyanosis, no clubbing Skin:  No rashes no nodules Neuro:  CN II through XII intact, motor grossly intact  EKG - nsr  Assess/Plan:  Possible PAF - She wore a Zio monitor and had no atrial fib. We discussed the options today. She is not interested in systemic anti-coag or ILR. She has a Kardio mobile and I encouraged her to call if she has any more atrial fib. Coags - she is not on systemic anti-coagulation. We discussed the pro's and con's of starting these meds or not. She wishes to hold off for now.   Carleene Overlie Jazziel Fitzsimmons,MD

## 2022-10-06 DIAGNOSIS — M25511 Pain in right shoulder: Secondary | ICD-10-CM | POA: Diagnosis not present

## 2022-10-13 DIAGNOSIS — M25511 Pain in right shoulder: Secondary | ICD-10-CM | POA: Diagnosis not present

## 2022-10-16 DIAGNOSIS — M25511 Pain in right shoulder: Secondary | ICD-10-CM | POA: Diagnosis not present

## 2022-10-16 DIAGNOSIS — M75101 Unspecified rotator cuff tear or rupture of right shoulder, not specified as traumatic: Secondary | ICD-10-CM | POA: Diagnosis not present

## 2022-10-16 DIAGNOSIS — M19011 Primary osteoarthritis, right shoulder: Secondary | ICD-10-CM | POA: Diagnosis not present

## 2022-10-20 DIAGNOSIS — M25511 Pain in right shoulder: Secondary | ICD-10-CM | POA: Diagnosis not present

## 2022-10-23 DIAGNOSIS — M47812 Spondylosis without myelopathy or radiculopathy, cervical region: Secondary | ICD-10-CM | POA: Diagnosis not present

## 2022-11-04 DIAGNOSIS — M5126 Other intervertebral disc displacement, lumbar region: Secondary | ICD-10-CM | POA: Diagnosis not present

## 2022-11-04 DIAGNOSIS — M47812 Spondylosis without myelopathy or radiculopathy, cervical region: Secondary | ICD-10-CM | POA: Diagnosis not present

## 2022-11-06 DIAGNOSIS — M47812 Spondylosis without myelopathy or radiculopathy, cervical region: Secondary | ICD-10-CM | POA: Diagnosis not present

## 2023-02-17 NOTE — Progress Notes (Unsigned)
Cardiology Office Note:    Date:  02/18/2023   ID:  Robin Doyle, DOB 1951-03-19, MRN [id number]  PCP:  Paulina Fusi, MD  Cardiologist:  Norman Herrlich, MD    Referring MD: Paulina Fusi, MD    ASSESSMENT:    1. Premature atrial contractions   2. Paroxysmal atrial fibrillation (HCC)    PLAN:    In order of problems listed above:  Fortunately seen by EP not having atrial fibrillation continue close observation self monitor with the mobile Kardia device and beta-blocker as needed and does not require antiarrhythmic drug or anticoagulation.   Next appointment: 6 months   Medication Adjustments/Labs and Tests Ordered: Current medicines are reviewed at length with the patient today.  Concerns regarding medicines are outlined above.  No orders of the defined types were placed in this encounter.  No orders of the defined types were placed in this encounter.    History of Present Illness:    Robin Doyle is a 72 y.o. female with a hx of APCs and atrial tachycardia last seen 08/06/2022. Compliance with diet, lifestyle and medications: Yes  She has brief palpitation not severe or sustained and has had no documented atrial fibrillation with her mobile Lourena Simmonds She has not needed to take her as needed beta-blocker Past Medical History:  Diagnosis Date   Allergy    Arrhythmia 02/05/2017   Arthritis    low back, posterior cervical, hips, shoulder   Back pain 02/05/2017   Colon polyps    hyperplastic, last study  May '09   Ectopic atrial tachycardia 06/24/2016   Heart palpitations    Hyperlipidemia LDL goal < 130 12/11/2010   Baseline LDL 160 June '12    Premature atrial contractions 06/24/2016   Routine general medical examination at a health care facility 12/11/2010    Current Medications: Current Meds  Medication Sig   Biotin 1000 MCG tablet Take 1,000 mcg by mouth daily.   Cyanocobalamin (VITAMIN B 12 PO) Take 1,000 mcg by mouth daily.   Flaxseed, Linseed,  (FLAXSEED OIL) 1000 MG CAPS Take 1,000 mg by mouth daily.   Magnesium 250 MG TABS Take 1 tablet by mouth daily.   metoprolol tartrate (LOPRESSOR) 25 MG tablet Take 1 tablet (25 mg) daily as needed for heart rate greater than 130.   Multiple Vitamins-Minerals (ALIVE WOMENS 50+ PO) Take 1 tablet by mouth daily.   Omega-3 Fatty Acids (FISH OIL) 1000 MG CAPS Take 1,000 mg by mouth daily.   Quercetin 250 MG TABS Take 250 mg by mouth daily at 12 noon.   TURMERIC PO Take 250 mg by mouth daily.      EKGs/Labs/Other Studies Reviewed:    The following studies were reviewed today:  Cardiac Studies & Procedures       ECHOCARDIOGRAM  ECHOCARDIOGRAM COMPLETE 11/24/2019  Narrative ECHOCARDIOGRAM REPORT    Patient Name:   Robin Doyle Date of Exam: 11/24/2019 Medical Rec #:  [id number]     Height:       66.5 in Accession #:    [id number]    Weight:       135.0 lb Date of Birth:  12-29-1950      BSA:          1.702 m Patient Age:    68 years      BP:           108/54 mmHg Patient Gender: F             HR:  72 bpm. 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 Risk Factors:Dyslipidemia. Atrial tachycardia.  Sonographer:    Leta Jungling RDCS Referring Phys: [id number] Jaquitta Dupriest J Keyli Duross  IMPRESSIONS   1. Left ventricular ejection fraction, by estimation, is 60 to 65%. The left ventricle has normal function. The left ventricle has no regional wall motion abnormalities. Left ventricular diastolic parameters were normal. 2. Right ventricular systolic function is normal. The right ventricular size is normal. 3. The mitral valve is normal in structure. No evidence of mitral valve regurgitation. No evidence of mitral stenosis. 4. The aortic valve is normal in structure. Aortic valve regurgitation is not visualized. Mild to moderate aortic valve  sclerosis/calcification is present, without any evidence of aortic stenosis.  FINDINGS Left Ventricle: Left ventricular ejection fraction, by estimation, is 60 to 65%. The left ventricle has normal function. The left ventricle has no regional wall motion abnormalities. The left ventricular internal cavity size was normal in size. There is no left ventricular hypertrophy. Left ventricular diastolic parameters were normal.  Right Ventricle: The right ventricular size is normal. No increase in right ventricular wall thickness. Right ventricular systolic function is normal.  Left Atrium: Left atrial size was normal in size.  Right Atrium: Right atrial size was normal in size.  Pericardium: There is no evidence of pericardial effusion.  Mitral Valve: The mitral valve is normal in structure. Normal mobility of the mitral valve leaflets. No evidence of mitral valve regurgitation. No evidence of mitral valve stenosis.  Tricuspid Valve: The tricuspid valve is normal in structure. Tricuspid valve regurgitation is trivial. No evidence of tricuspid stenosis.  Aortic Valve: The aortic valve is normal in structure.. There is mild thickening of the aortic valve. Aortic valve regurgitation is not visualized. Mild to moderate aortic valve sclerosis/calcification is present, without any evidence of aortic stenosis. There is mild thickening of the aortic valve.  Pulmonic Valve: The pulmonic valve was normal in structure. Pulmonic valve regurgitation is not visualized. No evidence of pulmonic stenosis.  Aorta: The aortic root is normal in size and structure.  Venous: The inferior vena cava is normal in size with greater than 50% respiratory variability, suggesting right atrial pressure of 3 mmHg.  IAS/Shunts: No atrial level shunt detected by color flow Doppler.   LEFT VENTRICLE PLAX 2D LVIDd:         4.15 cm  Diastology LVIDs:         2.66 cm  LV e' lateral:   10.10 cm/s LV PW:         0.92 cm  LV  E/e' lateral: 9.4 LV IVS:        0.74 cm  LV e' medial:    7.51 cm/s LVOT diam:     1.90 cm  LV E/e' medial:  12.6 LV SV:         51 LV SV Index:   30 LVOT Area:     2.84 cm   RIGHT VENTRICLE RV S prime:     11.70 cm/s TAPSE (M-mode): 2.0 cm  LEFT ATRIUM             Index       RIGHT ATRIUM           Index LA diam:        3.10 cm 1.82 cm/m  RA Area:  11.20 cm LA Vol (A2C):   33.9 ml 19.92 ml/m RA Volume:   26.00 ml  15.28 ml/m LA Vol (A4C):   39.4 ml 23.15 ml/m LA Biplane Vol: 37.0 ml 21.74 ml/m AORTIC VALVE LVOT Vmax:   85.70 cm/s LVOT Vmean:  53.600 cm/s LVOT VTI:    0.181 m  AORTA Ao Root diam: 2.40 cm Ao Asc diam:  2.70 cm  MITRAL VALVE MV Area (PHT): 6.17 cm    SHUNTS MV Decel Time: 123 msec    Systemic VTI:  0.18 m MV E velocity: 94.60 cm/s  Systemic Diam: 1.90 cm MV A velocity: 53.40 cm/s MV E/A ratio:  1.77  Gypsy Balsam MD Electronically signed by Gypsy Balsam MD Signature Date/Time: 11/24/2019/12:59:14 PM    Final    MONITORS  LONG TERM MONITOR (3-14 DAYS) 09/05/2022  Narrative NSR with sinus brady and sinus tachycardia Brief episodes of NS SVT, likely atrial tachycardia Occaisional PAC's and PVC's No prolonged pauses  Gregg Taylor,MD  Patch Wear Time:  13 days and 11 hours (2024-02-22T22:56:09-498 to 2024-03-07T10:54:40-0500)  Patient had a min HR of 49 bpm, max HR of 182 bpm, and avg HR of 78 bpm. Predominant underlying rhythm was Sinus Rhythm. 29 Supraventricular Tachycardia runs occurred, the run with the fastest interval lasting 5 beats with a max rate of 182 bpm, the longest lasting 14 beats with an avg rate of 86 bpm. Some episodes of Supraventricular Tachycardia may be possible Atrial Tachycardia with variable block. Isolated SVEs were frequent (13.7%, [id number]), SVE Couplets were rare (<1.0%, 4178), and SVE Triplets were rare (<1.0%, 1795). Isolated VEs were rare (<1.0%), and no VE Couplets or VE Triplets were present.                Recent Labs: 06/11/2022: BUN 21; Creatinine, Ser 0.97; Potassium 4.0; Sodium 143  Recent Lipid Panel    Component Value Date/Time   CHOL 218 (H) 12/10/2010 0802   TRIG 51.0 12/10/2010 0802   HDL 44.80 12/10/2010 0802   CHOLHDL 5 12/10/2010 0802   VLDL 10.2 12/10/2010 0802   LDLDIRECT 160.1 12/10/2010 0802    Physical Exam:    VS:  BP 112/80 (BP Location: Left Arm, Patient Position: Sitting, Cuff Size: Normal)   Pulse 77   Ht 5\' 6"  (1.676 m)   Wt 133 lb 9.6 oz (60.6 kg)   SpO2 100%   BMI 21.56 kg/m     Wt Readings from Last 3 Encounters:  02/18/23 133 lb 9.6 oz (60.6 kg)  09/23/22 133 lb 6.4 oz (60.5 kg)  08/12/22 131 lb 3.2 oz (59.5 kg)     GEN:  Well nourished, well developed in no acute distress HEENT: Normal NECK: No JVD; No carotid bruits LYMPHATICS: No lymphadenopathy CARDIAC: RRR, no murmurs, rubs, gallops RESPIRATORY:  Clear to auscultation without rales, wheezing or rhonchi  ABDOMEN: Soft, non-tender, non-distended MUSCULOSKELETAL:  No edema; No deformity  SKIN: Warm and dry NEUROLOGIC:  Alert and oriented x 3 PSYCHIATRIC:  Normal affect    Signed, Norman Herrlich, MD  02/18/2023 3:48 PM    [facility]

## 2023-02-18 ENCOUNTER — Ambulatory Visit: Payer: Medicare Other | Attending: Cardiology | Admitting: Cardiology

## 2023-02-18 ENCOUNTER — Encounter: Payer: Self-pay | Admitting: Cardiology

## 2023-02-18 VITALS — BP 112/80 | HR 77 | Ht 66.0 in | Wt 133.6 lb

## 2023-02-18 DIAGNOSIS — I491 Atrial premature depolarization: Secondary | ICD-10-CM | POA: Insufficient documentation

## 2023-02-18 DIAGNOSIS — I480 Paroxysmal atrial fibrillation: Secondary | ICD-10-CM | POA: Insufficient documentation

## 2023-02-18 NOTE — Patient Instructions (Signed)

## 2023-03-10 DIAGNOSIS — Z0000 Encounter for general adult medical examination without abnormal findings: Secondary | ICD-10-CM | POA: Diagnosis not present

## 2023-03-10 DIAGNOSIS — Z9181 History of falling: Secondary | ICD-10-CM | POA: Diagnosis not present

## 2023-03-20 DIAGNOSIS — B880 Other acariasis: Secondary | ICD-10-CM | POA: Diagnosis not present

## 2023-03-20 DIAGNOSIS — H01004 Unspecified blepharitis left upper eyelid: Secondary | ICD-10-CM | POA: Diagnosis not present

## 2023-07-02 DIAGNOSIS — S52502A Unspecified fracture of the lower end of left radius, initial encounter for closed fracture: Secondary | ICD-10-CM | POA: Diagnosis not present

## 2023-07-02 DIAGNOSIS — M25532 Pain in left wrist: Secondary | ICD-10-CM | POA: Insufficient documentation

## 2023-07-02 DIAGNOSIS — S52509A Unspecified fracture of the lower end of unspecified radius, initial encounter for closed fracture: Secondary | ICD-10-CM | POA: Insufficient documentation

## 2023-07-03 ENCOUNTER — Ambulatory Visit
Admission: RE | Admit: 2023-07-03 | Discharge: 2023-07-03 | Disposition: A | Discharge status: Home / Self Care | Payer: Medicare Other | Source: Ambulatory Visit | Attending: Orthopedic Surgery | Admitting: Orthopedic Surgery

## 2023-07-03 ENCOUNTER — Other Ambulatory Visit: Payer: Self-pay | Admitting: Orthopedic Surgery

## 2023-07-03 DIAGNOSIS — Z01818 Encounter for other preprocedural examination: Secondary | ICD-10-CM

## 2023-07-03 DIAGNOSIS — M25532 Pain in left wrist: Secondary | ICD-10-CM

## 2023-07-03 DIAGNOSIS — S52512A Displaced fracture of left radial styloid process, initial encounter for closed fracture: Secondary | ICD-10-CM | POA: Diagnosis not present

## 2023-07-03 DIAGNOSIS — S52612A Displaced fracture of left ulna styloid process, initial encounter for closed fracture: Secondary | ICD-10-CM | POA: Diagnosis not present

## 2023-07-03 DIAGNOSIS — S52572A Other intraarticular fracture of lower end of left radius, initial encounter for closed fracture: Secondary | ICD-10-CM | POA: Diagnosis not present

## 2023-07-03 DIAGNOSIS — S52502A Unspecified fracture of the lower end of left radius, initial encounter for closed fracture: Secondary | ICD-10-CM | POA: Diagnosis not present

## 2023-07-09 DIAGNOSIS — S52572A Other intraarticular fracture of lower end of left radius, initial encounter for closed fracture: Secondary | ICD-10-CM | POA: Diagnosis not present

## 2023-07-09 DIAGNOSIS — S63015A Dislocation of distal radioulnar joint of left wrist, initial encounter: Secondary | ICD-10-CM | POA: Diagnosis not present

## 2023-07-09 DIAGNOSIS — S52612A Displaced fracture of left ulna styloid process, initial encounter for closed fracture: Secondary | ICD-10-CM | POA: Diagnosis not present

## 2023-07-09 DIAGNOSIS — X58XXXA Exposure to other specified factors, initial encounter: Secondary | ICD-10-CM | POA: Diagnosis not present

## 2023-07-09 DIAGNOSIS — Y999 Unspecified external cause status: Secondary | ICD-10-CM | POA: Diagnosis not present

## 2023-07-09 DIAGNOSIS — S63012A Subluxation of distal radioulnar joint of left wrist, initial encounter: Secondary | ICD-10-CM | POA: Diagnosis not present

## 2023-07-24 DIAGNOSIS — S52502D Unspecified fracture of the lower end of left radius, subsequent encounter for closed fracture with routine healing: Secondary | ICD-10-CM | POA: Diagnosis not present

## 2023-07-24 DIAGNOSIS — M25522 Pain in left elbow: Secondary | ICD-10-CM | POA: Insufficient documentation

## 2023-07-30 DIAGNOSIS — M25632 Stiffness of left wrist, not elsewhere classified: Secondary | ICD-10-CM | POA: Diagnosis not present

## 2023-08-03 DIAGNOSIS — M25632 Stiffness of left wrist, not elsewhere classified: Secondary | ICD-10-CM | POA: Diagnosis not present

## 2023-08-11 DIAGNOSIS — M25632 Stiffness of left wrist, not elsewhere classified: Secondary | ICD-10-CM | POA: Diagnosis not present

## 2023-08-17 DIAGNOSIS — M25632 Stiffness of left wrist, not elsewhere classified: Secondary | ICD-10-CM | POA: Diagnosis not present

## 2023-08-17 DIAGNOSIS — S52502D Unspecified fracture of the lower end of left radius, subsequent encounter for closed fracture with routine healing: Secondary | ICD-10-CM | POA: Diagnosis not present

## 2023-08-26 ENCOUNTER — Encounter: Payer: Self-pay | Admitting: Cardiology

## 2023-08-26 NOTE — Progress Notes (Deleted)
 Cardiology Office Note:    Date:  08/26/2023   ID:  Robin Doyle, DOB 1950-09-07, MRN [id number]  PCP:  Paulina Fusi, MD  Cardiologist:  Norman Herrlich, MD    Referring MD: Paulina Fusi, MD    ASSESSMENT:    No diagnosis found. PLAN:    In order of problems listed above:  ***   Next appointment: ***   Medication Adjustments/Labs and Tests Ordered: Current medicines are reviewed at length with the patient today.  Concerns regarding medicines are outlined above.  No orders of the defined types were placed in this encounter.  No orders of the defined types were placed in this encounter.    History of Present Illness:    Robin Doyle is a 73 y.o. female with a hx of atrial arrhythmia with paroxysmal atrial fibrillation and symptomatic APCs last seen for 02/18/2023. 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   Allergy    Arrhythmia 02/05/2017   Arthritis    low back, posterior cervical, hips, shoulder   Back pain 02/05/2017   Bilateral bunions 12/11/2021   Colon polyps    hyperplastic, last study  May '09   Dyslipidemia 12/11/2010   Baseline LDL 160 June '12     Ectopic atrial tachycardia (HCC) 06/24/2016   Heart palpitations    High risk medication use 02/08/2017   FLECANIDE     History of adenomatous polyp of colon 04/28/2018   Hyperlipidemia LDL goal < 130 12/11/2010   Baseline LDL 160 June '12    Internal hemorrhoids 04/28/2018   Paroxysmal atrial fibrillation (HCC) 08/12/2022   Premature atrial contractions 06/24/2016   Routine general medical examination at a health care facility 12/11/2010   SUI (stress urinary incontinence, female) 02/27/2022    Current Medications: No outpatient medications have been marked as taking for the 08/27/23 encounter (Appointment) with Baldo Daub, MD.      EKGs/Labs/Other  Studies Reviewed:    The following studies were reviewed today:  Cardiac Studies & Procedures   ______________________________________________________________________________________________     ECHOCARDIOGRAM  ECHOCARDIOGRAM COMPLETE 11/24/2019  Narrative ECHOCARDIOGRAM REPORT    Patient Name:   Robin Doyle Date of Exam: 11/24/2019 Medical Rec #:  [id number]     Height:       66.5 in Accession #:    [id number]    Weight:       135.0 lb Date of Birth:  Aug 21, 1950      BSA:          1.702 m Patient Age:    68 years      BP:           108/54 mmHg Patient Gender: F             HR:           72 bpm. 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 Risk Factors:Dyslipidemia. Atrial tachycardia.  Sonographer:    Leta Jungling RDCS Referring Phys: [id number] Saje Gallop J Michaelene Dutan  IMPRESSIONS   1. Left ventricular ejection fraction, by estimation, is 60 to 65%. The left ventricle has normal function. The left ventricle has no regional wall motion abnormalities. Left ventricular diastolic parameters were normal. 2. Right ventricular systolic function  is normal. The right ventricular size is normal. 3. The mitral valve is normal in structure. No evidence of mitral valve regurgitation. No evidence of mitral stenosis. 4. The aortic valve is normal in structure. Aortic valve regurgitation is not visualized. Mild to moderate aortic valve sclerosis/calcification is present, without any evidence of aortic stenosis.  FINDINGS Left Ventricle: Left ventricular ejection fraction, by estimation, is 60 to 65%. The left ventricle has normal function. The left ventricle has no regional wall motion abnormalities. The left ventricular internal cavity size was normal in size. There is no left ventricular hypertrophy. Left ventricular diastolic parameters were  normal.  Right Ventricle: The right ventricular size is normal. No increase in right ventricular wall thickness. Right ventricular systolic function is normal.  Left Atrium: Left atrial size was normal in size.  Right Atrium: Right atrial size was normal in size.  Pericardium: There is no evidence of pericardial effusion.  Mitral Valve: The mitral valve is normal in structure. Normal mobility of the mitral valve leaflets. No evidence of mitral valve regurgitation. No evidence of mitral valve stenosis.  Tricuspid Valve: The tricuspid valve is normal in structure. Tricuspid valve regurgitation is trivial. No evidence of tricuspid stenosis.  Aortic Valve: The aortic valve is normal in structure.. There is mild thickening of the aortic valve. Aortic valve regurgitation is not visualized. Mild to moderate aortic valve sclerosis/calcification is present, without any evidence of aortic stenosis. There is mild thickening of the aortic valve.  Pulmonic Valve: The pulmonic valve was normal in structure. Pulmonic valve regurgitation is not visualized. No evidence of pulmonic stenosis.  Aorta: The aortic root is normal in size and structure.  Venous: The inferior vena cava is normal in size with greater than 50% respiratory variability, suggesting right atrial pressure of 3 mmHg.  IAS/Shunts: No atrial level shunt detected by color flow Doppler.   LEFT VENTRICLE PLAX 2D LVIDd:         4.15 cm  Diastology LVIDs:         2.66 cm  LV e' lateral:   10.10 cm/s LV PW:         0.92 cm  LV E/e' lateral: 9.4 LV IVS:        0.74 cm  LV e' medial:    7.51 cm/s LVOT diam:     1.90 cm  LV E/e' medial:  12.6 LV SV:         51 LV SV Index:   30 LVOT Area:     2.84 cm   RIGHT VENTRICLE RV S prime:     11.70 cm/s TAPSE (M-mode): 2.0 cm  LEFT ATRIUM             Index       RIGHT ATRIUM           Index LA diam:        3.10 cm 1.82 cm/m  RA Area:     11.20 cm LA Vol (A2C):   33.9 ml 19.92 ml/m RA  Volume:   26.00 ml  15.28 ml/m LA Vol (A4C):   39.4 ml 23.15 ml/m LA Biplane Vol: 37.0 ml 21.74 ml/m AORTIC VALVE LVOT Vmax:   85.70 cm/s LVOT Vmean:  53.600 cm/s LVOT VTI:    0.181 m  AORTA Ao Root diam: 2.40 cm Ao Asc diam:  2.70 cm  MITRAL VALVE MV Area (PHT): 6.17 cm    SHUNTS MV Decel Time: 123 msec    Systemic VTI:  0.18 m  MV E velocity: 94.60 cm/s  Systemic Diam: 1.90 cm MV A velocity: 53.40 cm/s MV E/A ratio:  1.77  Gypsy Balsam MD Electronically signed by Gypsy Balsam MD Signature Date/Time: 11/24/2019/12:59:14 PM    Final    MONITORS  LONG TERM MONITOR (3-14 DAYS) 09/05/2022  Narrative NSR with sinus brady and sinus tachycardia Brief episodes of NS SVT, likely atrial tachycardia Occaisional PAC's and PVC's No prolonged pauses  Gregg Taylor,MD  Patch Wear Time:  13 days and 11 hours (2024-02-22T22:56:09-498 to 2024-03-07T10:54:40-0500)  Patient had a min HR of 49 bpm, max HR of 182 bpm, and avg HR of 78 bpm. Predominant underlying rhythm was Sinus Rhythm. 29 Supraventricular Tachycardia runs occurred, the run with the fastest interval lasting 5 beats with a max rate of 182 bpm, the longest lasting 14 beats with an avg rate of 86 bpm. Some episodes of Supraventricular Tachycardia may be possible Atrial Tachycardia with variable block. Isolated SVEs were frequent (13.7%, [id number]), SVE Couplets were rare (<1.0%, 4178), and SVE Triplets were rare (<1.0%, 1795). Isolated VEs were rare (<1.0%), and no VE Couplets or VE Triplets were present.       ______________________________________________________________________________________________          Recent Labs: No results found for requested labs within last 365 days.  Recent Lipid Panel    Component Value Date/Time   CHOL 218 (H) 12/10/2010 0802   TRIG 51.0 12/10/2010 0802   HDL 44.80 12/10/2010 0802   CHOLHDL 5 12/10/2010 0802   VLDL 10.2 12/10/2010 0802   LDLDIRECT 160.1 12/10/2010 0802     Physical Exam:    VS:  There were no vitals taken for this visit.    Wt Readings from Last 3 Encounters:  02/18/23 133 lb 9.6 oz (60.6 kg)  09/23/22 133 lb 6.4 oz (60.5 kg)  08/12/22 131 lb 3.2 oz (59.5 kg)     GEN: *** Well nourished, well developed in no acute distress HEENT: Normal NECK: No JVD; No carotid bruits LYMPHATICS: No lymphadenopathy CARDIAC: ***RRR, no murmurs, rubs, gallops RESPIRATORY:  Clear to auscultation without rales, wheezing or rhonchi  ABDOMEN: Soft, non-tender, non-distended MUSCULOSKELETAL:  No edema; No deformity  SKIN: Warm and dry NEUROLOGIC:  Alert and oriented x 3 PSYCHIATRIC:  Normal affect    Signed, Norman Herrlich, MD  08/26/2023 12:57 PM    [facility]

## 2023-08-27 ENCOUNTER — Ambulatory Visit: Payer: Medicare Other | Admitting: Cardiology

## 2023-08-27 DIAGNOSIS — I480 Paroxysmal atrial fibrillation: Secondary | ICD-10-CM

## 2023-08-27 DIAGNOSIS — M25632 Stiffness of left wrist, not elsewhere classified: Secondary | ICD-10-CM | POA: Diagnosis not present

## 2023-08-27 DIAGNOSIS — I491 Atrial premature depolarization: Secondary | ICD-10-CM

## 2023-08-29 NOTE — Progress Notes (Unsigned)
 Cardiology Office Note:    Date:  08/29/2023   ID:  Robin Doyle, DOB 1950/07/13, MRN [id number]  PCP:  Paulina Fusi, MD  Cardiologist:  Norman Herrlich, MD    Referring MD: Paulina Fusi, MD    ASSESSMENT:    1. Premature atrial contractions   2. Paroxysmal atrial fibrillation (HCC)    PLAN:    In order of problems listed above:  ***   Next appointment: ***   Medication Adjustments/Labs and Tests Ordered: Current medicines are reviewed at length with the patient today.  Concerns regarding medicines are outlined above.  No orders of the defined types were placed in this encounter.  No orders of the defined types were placed in this encounter.    History of Present Illness:    Robin Doyle is a 73 y.o. female with a hx of atrial arrhythmia with paroxysmal atrial fibrillation and symptomatic APCs last seen for 02/18/2023. 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   Allergy    Arrhythmia 02/05/2017   Arthritis    low back, posterior cervical, hips, shoulder   Back pain 02/05/2017   Bilateral bunions 12/11/2021   Colon polyps    hyperplastic, last study  May '09   Dyslipidemia 12/11/2010   Baseline LDL 160 June '12     Ectopic atrial tachycardia (HCC) 06/24/2016   Heart palpitations    High risk medication use 02/08/2017   FLECANIDE     History of adenomatous polyp of colon 04/28/2018   Hyperlipidemia LDL goal < 130 12/11/2010   Baseline LDL 160 June '12    Internal hemorrhoids 04/28/2018   Paroxysmal atrial fibrillation (HCC) 08/12/2022   Premature atrial contractions 06/24/2016   Routine general medical examination at a health care facility 12/11/2010   SUI (stress urinary incontinence, female) 02/27/2022    Current Medications: No outpatient medications have been marked as taking for the 08/31/23 encounter  (Appointment) with Baldo Daub, MD.      EKGs/Labs/Other Studies Reviewed:    The following studies were reviewed today:  Cardiac Studies & Procedures   ______________________________________________________________________________________________     ECHOCARDIOGRAM  ECHOCARDIOGRAM COMPLETE 11/24/2019  Narrative ECHOCARDIOGRAM REPORT    Patient Name:   Robin Doyle Date of Exam: 11/24/2019 Medical Rec #:  [id number]     Height:       66.5 in Accession #:    [id number]    Weight:       135.0 lb Date of Birth:  Mar 19, 1951      BSA:          1.702 m Patient Age:    68 years      BP:           108/54 mmHg Patient Gender: F             HR:           72 bpm. 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 Risk Factors:Dyslipidemia. Atrial tachycardia.  Sonographer:    Leta Jungling RDCS Referring Phys: [id number] Yanelie Abraha J Tyasia Packard  IMPRESSIONS   1. Left ventricular ejection fraction, by estimation, is 60 to 65%. The left ventricle has normal function. The left ventricle has no regional wall motion abnormalities.  Left ventricular diastolic parameters were normal. 2. Right ventricular systolic function is normal. The right ventricular size is normal. 3. The mitral valve is normal in structure. No evidence of mitral valve regurgitation. No evidence of mitral stenosis. 4. The aortic valve is normal in structure. Aortic valve regurgitation is not visualized. Mild to moderate aortic valve sclerosis/calcification is present, without any evidence of aortic stenosis.  FINDINGS Left Ventricle: Left ventricular ejection fraction, by estimation, is 60 to 65%. The left ventricle has normal function. The left ventricle has no regional wall motion abnormalities. The left ventricular internal cavity size was normal in size. There is no left  ventricular hypertrophy. Left ventricular diastolic parameters were normal.  Right Ventricle: The right ventricular size is normal. No increase in right ventricular wall thickness. Right ventricular systolic function is normal.  Left Atrium: Left atrial size was normal in size.  Right Atrium: Right atrial size was normal in size.  Pericardium: There is no evidence of pericardial effusion.  Mitral Valve: The mitral valve is normal in structure. Normal mobility of the mitral valve leaflets. No evidence of mitral valve regurgitation. No evidence of mitral valve stenosis.  Tricuspid Valve: The tricuspid valve is normal in structure. Tricuspid valve regurgitation is trivial. No evidence of tricuspid stenosis.  Aortic Valve: The aortic valve is normal in structure.. There is mild thickening of the aortic valve. Aortic valve regurgitation is not visualized. Mild to moderate aortic valve sclerosis/calcification is present, without any evidence of aortic stenosis. There is mild thickening of the aortic valve.  Pulmonic Valve: The pulmonic valve was normal in structure. Pulmonic valve regurgitation is not visualized. No evidence of pulmonic stenosis.  Aorta: The aortic root is normal in size and structure.  Venous: The inferior vena cava is normal in size with greater than 50% respiratory variability, suggesting right atrial pressure of 3 mmHg.  IAS/Shunts: No atrial level shunt detected by color flow Doppler.   LEFT VENTRICLE PLAX 2D LVIDd:         4.15 cm  Diastology LVIDs:         2.66 cm  LV e' lateral:   10.10 cm/s LV PW:         0.92 cm  LV E/e' lateral: 9.4 LV IVS:        0.74 cm  LV e' medial:    7.51 cm/s LVOT diam:     1.90 cm  LV E/e' medial:  12.6 LV SV:         51 LV SV Index:   30 LVOT Area:     2.84 cm   RIGHT VENTRICLE RV S prime:     11.70 cm/s TAPSE (M-mode): 2.0 cm  LEFT ATRIUM             Index       RIGHT ATRIUM           Index LA diam:        3.10 cm 1.82 cm/m   RA Area:     11.20 cm LA Vol (A2C):   33.9 ml 19.92 ml/m RA Volume:   26.00 ml  15.28 ml/m LA Vol (A4C):   39.4 ml 23.15 ml/m LA Biplane Vol: 37.0 ml 21.74 ml/m AORTIC VALVE LVOT Vmax:   85.70 cm/s LVOT Vmean:  53.600 cm/s LVOT VTI:    0.181 m  AORTA Ao Root diam: 2.40 cm Ao Asc diam:  2.70 cm  MITRAL VALVE MV Area (PHT): 6.17 cm    SHUNTS MV Decel  Time: 123 msec    Systemic VTI:  0.18 m MV E velocity: 94.60 cm/s  Systemic Diam: 1.90 cm MV A velocity: 53.40 cm/s MV E/A ratio:  1.77  Gypsy Balsam MD Electronically signed by Gypsy Balsam MD Signature Date/Time: 11/24/2019/12:59:14 PM    Final    MONITORS  LONG TERM MONITOR (3-14 DAYS) 09/05/2022  Narrative NSR with sinus brady and sinus tachycardia Brief episodes of NS SVT, likely atrial tachycardia Occaisional PAC's and PVC's No prolonged pauses  Gregg Taylor,MD  Patch Wear Time:  13 days and 11 hours (2024-02-22T22:56:09-498 to 2024-03-07T10:54:40-0500)  Patient had a min HR of 49 bpm, max HR of 182 bpm, and avg HR of 78 bpm. Predominant underlying rhythm was Sinus Rhythm. 29 Supraventricular Tachycardia runs occurred, the run with the fastest interval lasting 5 beats with a max rate of 182 bpm, the longest lasting 14 beats with an avg rate of 86 bpm. Some episodes of Supraventricular Tachycardia may be possible Atrial Tachycardia with variable block. Isolated SVEs were frequent (13.7%, [id number]), SVE Couplets were rare (<1.0%, 4178), and SVE Triplets were rare (<1.0%, 1795). Isolated VEs were rare (<1.0%), and no VE Couplets or VE Triplets were present.       ______________________________________________________________________________________________          Recent Labs: No results found for requested labs within last 365 days.  Recent Lipid Panel    Component Value Date/Time   CHOL 218 (H) 12/10/2010 0802   TRIG 51.0 12/10/2010 0802   HDL 44.80 12/10/2010 0802   CHOLHDL 5 12/10/2010 0802    VLDL 10.2 12/10/2010 0802   LDLDIRECT 160.1 12/10/2010 0802    Physical Exam:    VS:  There were no vitals taken for this visit.    Wt Readings from Last 3 Encounters:  02/18/23 133 lb 9.6 oz (60.6 kg)  09/23/22 133 lb 6.4 oz (60.5 kg)  08/12/22 131 lb 3.2 oz (59.5 kg)     GEN: *** Well nourished, well developed in no acute distress HEENT: Normal NECK: No JVD; No carotid bruits LYMPHATICS: No lymphadenopathy CARDIAC: ***RRR, no murmurs, rubs, gallops RESPIRATORY:  Clear to auscultation without rales, wheezing or rhonchi  ABDOMEN: Soft, non-tender, non-distended MUSCULOSKELETAL:  No edema; No deformity  SKIN: Warm and dry NEUROLOGIC:  Alert and oriented x 3 PSYCHIATRIC:  Normal affect    Signed, Norman Herrlich, MD  08/29/2023 2:41 PM    [facility]

## 2023-08-31 ENCOUNTER — Ambulatory Visit: Attending: Cardiology | Admitting: Cardiology

## 2023-08-31 ENCOUNTER — Encounter: Payer: Self-pay | Admitting: Cardiology

## 2023-08-31 VITALS — BP 112/80 | HR 66 | Ht 66.0 in | Wt 128.2 lb

## 2023-08-31 DIAGNOSIS — M25632 Stiffness of left wrist, not elsewhere classified: Secondary | ICD-10-CM | POA: Diagnosis not present

## 2023-08-31 DIAGNOSIS — I480 Paroxysmal atrial fibrillation: Secondary | ICD-10-CM | POA: Diagnosis not present

## 2023-08-31 DIAGNOSIS — I491 Atrial premature depolarization: Secondary | ICD-10-CM | POA: Diagnosis not present

## 2023-08-31 NOTE — Patient Instructions (Signed)
 Medication Instructions:  Your physician recommends that you continue on your current medications as directed. Please refer to the Current Medication list given to you today.  *If you need a refill on your cardiac medications before your next appointment, please call your pharmacy*   Lab Work: Your physician recommends that you return for lab work in:   Labs today: BMP, Magnesium  If you have labs (blood work) drawn today and your tests are completely normal, you will receive your results only by: MyChart Message (if you have MyChart) OR A paper copy in the mail If you have any lab test that is abnormal or we need to change your treatment, we will call you to review the results.   Testing/Procedures: None   Follow-Up: At [facility], you and your health needs are our priority.  As part of our continuing mission to provide you with exceptional heart care, we have created designated Provider Care Teams.  These Care Teams include your primary Cardiologist (physician) and Advanced Practice Providers (APPs -  Physician Assistants and Nurse Practitioners) who all work together to provide you with the care you need, when you need it.  We recommend signing up for the patient portal called "MyChart".  Sign up information is provided on this After Visit Summary.  MyChart is used to connect with patients for Virtual Visits (Telemedicine).  Patients are able to view lab/test results, encounter notes, upcoming appointments, etc.  Non-urgent messages can be sent to your provider as well.   To learn more about what you can do with MyChart, go to [email or web address].    Your next appointment:   6 month(s)  Provider:   Norman Herrlich, MD    Other Instructions None

## 2023-09-01 LAB — BASIC METABOLIC PANEL
BUN/Creatinine Ratio: 16 (ref 12–28)
BUN: 17 mg/dL (ref 8–27)
CO2: 26 mmol/L (ref 20–29)
Calcium: 9.8 mg/dL (ref 8.7–10.3)
Chloride: 105 mmol/L (ref 96–106)
Creatinine, Ser: 1.06 mg/dL — ABNORMAL HIGH (ref 0.57–1.00)
Glucose: 77 mg/dL (ref 70–99)
Potassium: 4 mmol/L (ref 3.5–5.2)
Sodium: 145 mmol/L — ABNORMAL HIGH (ref 134–144)
eGFR: 55 mL/min/{1.73_m2} — ABNORMAL LOW (ref >59)

## 2023-09-01 LAB — MAGNESIUM: Magnesium: 2.1 mg/dL (ref 1.6–2.3)

## 2023-09-04 DIAGNOSIS — S52502D Unspecified fracture of the lower end of left radius, subsequent encounter for closed fracture with routine healing: Secondary | ICD-10-CM | POA: Diagnosis not present

## 2023-09-08 DIAGNOSIS — M25632 Stiffness of left wrist, not elsewhere classified: Secondary | ICD-10-CM | POA: Diagnosis not present

## 2023-09-22 DIAGNOSIS — S52572D Other intraarticular fracture of lower end of left radius, subsequent encounter for closed fracture with routine healing: Secondary | ICD-10-CM | POA: Diagnosis not present

## 2023-09-22 DIAGNOSIS — S52612D Displaced fracture of left ulna styloid process, subsequent encounter for closed fracture with routine healing: Secondary | ICD-10-CM | POA: Diagnosis not present

## 2023-10-08 ENCOUNTER — Other Ambulatory Visit: Payer: Self-pay

## 2023-10-08 ENCOUNTER — Encounter (HOSPITAL_BASED_OUTPATIENT_CLINIC_OR_DEPARTMENT_OTHER): Payer: Self-pay | Admitting: Orthopedic Surgery

## 2023-10-08 ENCOUNTER — Encounter (HOSPITAL_BASED_OUTPATIENT_CLINIC_OR_DEPARTMENT_OTHER)
Admission: RE | Admit: 2023-10-08 | Discharge: 2023-10-08 | Disposition: A | Discharge status: Home / Self Care | Source: Ambulatory Visit | Attending: Orthopedic Surgery | Admitting: Orthopedic Surgery

## 2023-10-08 DIAGNOSIS — Z01812 Encounter for preprocedural laboratory examination: Secondary | ICD-10-CM | POA: Diagnosis not present

## 2023-10-14 ENCOUNTER — Encounter (HOSPITAL_BASED_OUTPATIENT_CLINIC_OR_DEPARTMENT_OTHER): Payer: Self-pay | Admitting: Orthopedic Surgery

## 2023-10-14 ENCOUNTER — Ambulatory Visit (HOSPITAL_BASED_OUTPATIENT_CLINIC_OR_DEPARTMENT_OTHER): Admitting: Anesthesiology

## 2023-10-14 ENCOUNTER — Encounter (HOSPITAL_BASED_OUTPATIENT_CLINIC_OR_DEPARTMENT_OTHER)
Admission: RE | Disposition: A | Discharge status: Home / Self Care | Payer: Self-pay | Source: Home / Self Care | Attending: Orthopedic Surgery

## 2023-10-14 ENCOUNTER — Ambulatory Visit (HOSPITAL_BASED_OUTPATIENT_CLINIC_OR_DEPARTMENT_OTHER)

## 2023-10-14 ENCOUNTER — Ambulatory Visit (HOSPITAL_BASED_OUTPATIENT_CLINIC_OR_DEPARTMENT_OTHER)
Admission: RE | Admit: 2023-10-14 | Discharge: 2023-10-14 | Disposition: A | Discharge status: Home / Self Care | Attending: Orthopedic Surgery | Admitting: Orthopedic Surgery

## 2023-10-14 ENCOUNTER — Other Ambulatory Visit: Payer: Self-pay

## 2023-10-14 DIAGNOSIS — S52572A Other intraarticular fracture of lower end of left radius, initial encounter for closed fracture: Secondary | ICD-10-CM

## 2023-10-14 DIAGNOSIS — Z79899 Other long term (current) drug therapy: Secondary | ICD-10-CM | POA: Insufficient documentation

## 2023-10-14 DIAGNOSIS — M1990 Unspecified osteoarthritis, unspecified site: Secondary | ICD-10-CM | POA: Insufficient documentation

## 2023-10-14 DIAGNOSIS — I10 Essential (primary) hypertension: Secondary | ICD-10-CM | POA: Diagnosis not present

## 2023-10-14 DIAGNOSIS — I4891 Unspecified atrial fibrillation: Secondary | ICD-10-CM | POA: Insufficient documentation

## 2023-10-14 DIAGNOSIS — I480 Paroxysmal atrial fibrillation: Secondary | ICD-10-CM | POA: Diagnosis not present

## 2023-10-14 DIAGNOSIS — S52572D Other intraarticular fracture of lower end of left radius, subsequent encounter for closed fracture with routine healing: Secondary | ICD-10-CM | POA: Diagnosis not present

## 2023-10-14 DIAGNOSIS — M67834 Other specified disorders of tendon, left wrist: Secondary | ICD-10-CM | POA: Diagnosis not present

## 2023-10-14 DIAGNOSIS — Z472 Encounter for removal of internal fixation device: Secondary | ICD-10-CM | POA: Insufficient documentation

## 2023-10-14 DIAGNOSIS — R002 Palpitations: Secondary | ICD-10-CM

## 2023-10-14 DIAGNOSIS — Z01818 Encounter for other preprocedural examination: Secondary | ICD-10-CM

## 2023-10-14 HISTORY — PX: HARDWARE REMOVAL: SHX979

## 2023-10-14 SURGERY — REMOVAL, HARDWARE
Anesthesia: Monitor Anesthesia Care | Site: Wrist | Laterality: Left

## 2023-10-14 MED ORDER — DEXAMETHASONE SODIUM PHOSPHATE 10 MG/ML IJ SOLN
INTRAMUSCULAR | Status: AC
  Filled 2023-10-14: qty 1

## 2023-10-14 MED ORDER — ONDANSETRON HCL 4 MG/2ML IJ SOLN
INTRAMUSCULAR | Status: AC
  Filled 2023-10-14: qty 6

## 2023-10-14 MED ORDER — CEFAZOLIN SODIUM-DEXTROSE 2-4 GM/100ML-% IV SOLN
INTRAVENOUS | Status: AC
  Filled 2023-10-14: qty 100

## 2023-10-14 MED ORDER — 0.9 % SODIUM CHLORIDE (POUR BTL) OPTIME
TOPICAL | Status: DC | PRN
  Administered 2023-10-14: 500 mL

## 2023-10-14 MED ORDER — LACTATED RINGERS IV SOLN: INTRAVENOUS | Status: DC

## 2023-10-14 MED ORDER — OXYCODONE HCL 5 MG PO TABS: 5.0000 mg | ORAL_TABLET | Freq: Four times a day (QID) | ORAL | 0 refills | Status: AC | PRN

## 2023-10-14 MED ORDER — CLONIDINE HCL (ANALGESIA) 100 MCG/ML EP SOLN
EPIDURAL | Status: DC | PRN
  Administered 2023-10-14: 50 ug

## 2023-10-14 MED ORDER — FENTANYL CITRATE (PF) 100 MCG/2ML IJ SOLN
50.0000 ug | Freq: Once | INTRAMUSCULAR | Status: AC
  Administered 2023-10-14: 50 ug via INTRAVENOUS

## 2023-10-14 MED ORDER — MIDAZOLAM HCL 2 MG/2ML IJ SOLN
INTRAMUSCULAR | Status: AC
  Filled 2023-10-14: qty 2

## 2023-10-14 MED ORDER — CEFAZOLIN SODIUM-DEXTROSE 2-4 GM/100ML-% IV SOLN
2.0000 g | INTRAVENOUS | Status: AC
  Administered 2023-10-14: 2 g via INTRAVENOUS

## 2023-10-14 MED ORDER — PROPOFOL 1000 MG/100ML IV EMUL
INTRAVENOUS | Status: AC
  Filled 2023-10-14: qty 400

## 2023-10-14 MED ORDER — ROPIVACAINE HCL 5 MG/ML IJ SOLN
INTRAMUSCULAR | Status: DC | PRN
  Administered 2023-10-14: 30 mL via PERINEURAL

## 2023-10-14 MED ORDER — PHENYLEPHRINE HCL (PRESSORS) 10 MG/ML IV SOLN
INTRAVENOUS | Status: DC | PRN
  Administered 2023-10-14 (×4): 80 ug via INTRAVENOUS

## 2023-10-14 MED ORDER — FENTANYL CITRATE (PF) 100 MCG/2ML IJ SOLN
INTRAMUSCULAR | Status: AC
  Filled 2023-10-14: qty 2

## 2023-10-14 MED ORDER — PHENYLEPHRINE 80 MCG/ML (10ML) SYRINGE FOR IV PUSH (FOR BLOOD PRESSURE SUPPORT)
PREFILLED_SYRINGE | INTRAVENOUS | Status: AC
Start: 2023-10-14 — End: ?
  Filled 2023-10-14: qty 20

## 2023-10-14 MED ORDER — PROPOFOL 500 MG/50ML IV EMUL
INTRAVENOUS | Status: DC | PRN
  Administered 2023-10-14: 125 ug/kg/min via INTRAVENOUS

## 2023-10-14 SURGICAL SUPPLY — 34 items
BLADE SURG 15 STRL LF DISP TIS (BLADE) ×4 IMPLANT
BNDG ELASTIC 3INX 5YD STR LF (GAUZE/BANDAGES/DRESSINGS) ×2 IMPLANT
BNDG ESMARK 4X9 LF (GAUZE/BANDAGES/DRESSINGS) ×2 IMPLANT
BNDG GAUZE DERMACEA FLUFF 4 (GAUZE/BANDAGES/DRESSINGS) ×3 IMPLANT
BNDG PLASTER X FAST 3X3 WHT LF (CAST SUPPLIES) ×20 IMPLANT
CHLORAPREP W/TINT 26 (MISCELLANEOUS) ×2 IMPLANT
CORD BIPOLAR FORCEPS 12FT (ELECTRODE) ×2 IMPLANT
COVER BACK TABLE 60X90IN (DRAPES) ×2 IMPLANT
CUFF TOURN SGL QUICK 18X4 (TOURNIQUET CUFF) ×2 IMPLANT
CUFF TRNQT CYL 24X4X16.5-23 (TOURNIQUET CUFF) IMPLANT
DRAPE EXTREMITY T 121X128X90 (DISPOSABLE) ×2 IMPLANT
DRAPE OEC MINIVIEW 54X84 (DRAPES) ×2 IMPLANT
DRAPE SURG 17X23 STRL (DRAPES) ×2 IMPLANT
GAUZE SPONGE 4X4 12PLY STRL (GAUZE/BANDAGES/DRESSINGS) ×2 IMPLANT
GAUZE XEROFORM 1X8 LF (GAUZE/BANDAGES/DRESSINGS) IMPLANT
GLOVE BIO SURGEON STRL SZ7 (GLOVE) ×2 IMPLANT
GLOVE BIOGEL PI IND STRL 7.0 (GLOVE) ×2 IMPLANT
GOWN STRL REUS W/ TWL LRG LVL3 (GOWN DISPOSABLE) ×4 IMPLANT
GOWN STRL REUS W/ TWL XL LVL3 (GOWN DISPOSABLE) ×2 IMPLANT
NDL HYPO 25X1 1.5 SAFETY (NEEDLE) IMPLANT
NEEDLE HYPO 25X1 1.5 SAFETY (NEEDLE) IMPLANT
NS IRRIG 1000ML POUR BTL (IV SOLUTION) ×2 IMPLANT
PACK BASIN DAY SURGERY FS (CUSTOM PROCEDURE TRAY) ×2 IMPLANT
PAD CAST 3X4 CTTN HI CHSV (CAST SUPPLIES) ×2 IMPLANT
SHEET MEDIUM DRAPE 40X70 STRL (DRAPES) ×2 IMPLANT
SLEEVE SCD COMPRESS KNEE MED (STOCKING) ×1 IMPLANT
SLING ARM FOAM STRAP MED (SOFTGOODS) ×1 IMPLANT
SUT ETHILON 4 0 PS 2 18 (SUTURE) ×3 IMPLANT
SUT MNCRL AB 3-0 PS2 18 (SUTURE) ×2 IMPLANT
SUT VIC AB 4-0 PS2 18 (SUTURE) ×1 IMPLANT
SYR BULB EAR ULCER 3OZ GRN STR (SYRINGE) ×2 IMPLANT
SYR CONTROL 10ML LL (SYRINGE) IMPLANT
TOWEL GREEN STERILE FF (TOWEL DISPOSABLE) ×4 IMPLANT
UNDERPAD 30X36 HEAVY ABSORB (UNDERPADS AND DIAPERS) ×2 IMPLANT

## 2023-10-14 NOTE — H&P (Signed)
 HAND SURGERY   HPI: Patient is a 73 y.o. female who presents s/p ORIF of a severely comminuted, intra-articular distal radius fracture with both volar and dorsal spanning plates.  She's now approximately 13 weeks out from her fracture.  X-ray and CT scan demonstrates healing of the fracture. She presents today for hardware removal.  Patient denies any changes to their medical history or new systemic symptoms today.    Past Medical History:  Diagnosis Date   Acquired bilateral hammer toes 12/11/2021   Acquired plantar porokeratosis 12/11/2021   Allergy    Arrhythmia 02/05/2017   Arthritis    low back, posterior cervical, hips, shoulder   Back pain 02/05/2017   Bilateral bunions 12/11/2021   Colon polyps    hyperplastic, last study  May '09   Dyslipidemia 12/11/2010   Baseline LDL 160 June '12     Ectopic atrial tachycardia (HCC) 06/24/2016   Heart palpitations    High risk medication use 02/08/2017   FLECANIDE     History of adenomatous polyp of colon 04/28/2018   Hyperlipidemia LDL goal < 130 12/11/2010   Baseline LDL 160 June '12    Internal hemorrhoids 04/28/2018   Paroxysmal atrial fibrillation (HCC) 08/12/2022   Premature atrial contractions 06/24/2016   Routine general medical examination at a health care facility 12/11/2010   SUI (stress urinary incontinence, female) 02/27/2022   Past Surgical History:  Procedure Laterality Date   APPENDECTOMY  '67   G2P2     PILONIDAL CYST EXCISION  '70   TONSILLECTOMY  '54   Social History   Socioeconomic History   Marital status: Married    Spouse name: Not on file   Number of children: 2   Years of education: 14   Highest education level: Not on file  Occupational History   Occupation: Financial trader  Tobacco Use   Smoking status: Never   Smokeless tobacco: Never  Vaping Use   Vaping status: Never Used  Substance and Sexual Activity   Alcohol use: No   Drug use: No   Sexual activity: Yes    Partners: Male   Other Topics Concern   Not on file  Social History Narrative   HSG, trade school - Clinical biochemist. Married '70. 2 sons - '73, '75; 1 adopted grandson, 1 grand-daughter. Work - keeps the farm and cattle. Marriage is in good health.    Social Drivers of Corporate investment banker Strain: Not on file  Food Insecurity: Not on file  Transportation Needs: Not on file  Physical Activity: Not on file  Stress: Not on file  Social Connections: Not on file   Family History  Problem Relation Age of Onset   Arthritis Mother    Gout Mother    Hypertension Mother    Heart disease Mother        a. fib; pacemaker   Atrial fibrillation Mother    Heart failure Mother    Heart disease Father        CAD/MI; Mitral valve disease, a. fib   Gout Father    Hyperlipidemia Father    Arthritis Father        DDD, had surgrery with post-op pain.   Arrhythmia Father    Mental retardation Brother        changed due to closed head injury 3 wheeler accident   Cancer Maternal Aunt        breast cancer   Cancer Paternal Grandmother  colon   Cancer Maternal Aunt        breast cancer   Diabetes Neg Hx    - negative except otherwise stated in the family history section Allergies  Allergen Reactions   Fluconazole Swelling    Ulcers and lip swelling  Other Reaction(s): other   Flecainide  Other (See Comments)    "trembling, weakness, and blurred vision"   Codeine Nausea And Vomiting   Lidocaine Other (See Comments)    Mouth ulcers, lip swelling   Prednisone Other (See Comments) and Palpitations    "made me hyper"  Other Reaction(s): irregular heart rate, Other (See Comments)   Prior to Admission medications   Medication Sig Start Date End Date Taking? Authorizing Provider  Biotin 1000 MCG tablet Take 1,000 mcg by mouth daily.   Yes [provider]  Cyanocobalamin (VITAMIN B 12 PO) Take 1,000 mcg by mouth daily.   Yes [provider]  Flaxseed, Linseed, (FLAXSEED  OIL) 1000 MG CAPS Take 1,000 mg by mouth daily.   Yes [provider]  Magnesium 250 MG TABS Take 1 tablet by mouth daily.   Yes [provider]  metoprolol  tartrate (LOPRESSOR ) 25 MG tablet Take 1 tablet (25 mg) daily as needed for heart rate greater than 130. 11/23/18  Yes Munley, Margart Shears, MD  Multiple Vitamins-Minerals (ALIVE WOMENS 50+ PO) Take 1 tablet by mouth daily.   Yes [provider]  Omega-3 Fatty Acids (FISH OIL) 1000 MG CAPS Take 1,000 mg by mouth daily.   Yes [provider]  Quercetin 250 MG TABS Take 250 mg by mouth daily at 12 noon.   Yes [provider]  TURMERIC PO Take 250 mg by mouth daily.   Yes [provider]   No results found. - Positive ROS: All other systems have been reviewed and were otherwise negative with the exception of those mentioned in the HPI and as above.  Physical Exam: General: No acute distress, resting comfortably Cardiovascular: BUE warm and well perfused, normal rate Respiratory: Normal WOB on RA Skin: Warm and dry Neurologic: Sensation intact distally Psychiatric: Patient is at baseline mood and affect  Left Upper Extremity  Minimal swelling of wrist or forearm. Well healed incisions at dorsal aspect of hand and forearm and volar aspect of wrist.  She has full active range of motion of her fingers and full acitve forearm pronation and supination.  SILT m/u/r distributions.  Her hand is warm and well perfused w/ BCR.    Assessment: 73 yo F s/p ORIF severely comminuted, intra-articular distal radius fracture with volar locking and dorsal spanning plates.  She presents today for removal of the hardware.   Plan: OR today for removal of dorsal and volar plates with extensor tenolysis. . We again reviewed the risks of surgery which include bleeding, infection, damage to neurovascular structures, persistent stiffness, fracture, damage to extensor tendons, need for additional surgery.    Marilyn Shropshire, M.D. [facility] 7:09 AM

## 2023-10-14 NOTE — Progress Notes (Signed)
Assisted Dr. Casilda Carls with left, supraclavicular, ultrasound guided block. Side rails up, monitors on throughout procedure. See vital signs in flow sheet. Tolerated Procedure well.

## 2023-10-14 NOTE — Anesthesia Postprocedure Evaluation (Signed)
 Anesthesia Post Note  Patient: Robin Doyle  Procedure(s) Performed: REMOVAL, HARDWARE, EXTENSOR TENOLYSIS (Left: Wrist)     Patient location during evaluation: PACU Anesthesia Type: Regional Level of consciousness: awake and alert Pain management: pain level controlled Vital Signs Assessment: post-procedure vital signs reviewed and stable Respiratory status: spontaneous breathing, nonlabored ventilation, respiratory function stable and patient connected to nasal cannula oxygen Cardiovascular status: stable and blood pressure returned to baseline Postop Assessment: no apparent nausea or vomiting Anesthetic complications: no  No notable events documented.  Last Vitals:  Vitals:   10/14/23 1014 10/14/23 1041  BP:  113/62  Pulse: 60 (!) 56  Resp: 14 16  Temp:  (!) 36.2 C  SpO2: 96% 96%    Last Pain:  Vitals:   10/14/23 1041  PainSc: 0-No pain                 Melvenia Stabs

## 2023-10-14 NOTE — Anesthesia Procedure Notes (Signed)
 Anesthesia Regional Block: Supraclavicular block   Pre-Anesthetic Checklist: , timeout performed,  Correct Patient, Correct Site, Correct Laterality,  Correct Procedure, Correct Position, site marked,  Risks and benefits discussed,  Surgical consent,  Pre-op evaluation,  At surgeon's request and post-op pain management  Laterality: Left  Prep: chloraprep       Needles:  Injection technique: Single-shot  Needle Type: Echogenic Needle     Needle Length: 9cm  Needle Gauge: 21     Additional Needles:   Procedures:,,,, ultrasound used (permanent image in chart),,    Narrative:  Start time: 10/14/2023 7:45 AM End time: 10/14/2023 7:51 AM Injection made incrementally with aspirations every 5 mL.  Performed by: Personally  Anesthesiologist: Peggy Bowens, MD

## 2023-10-14 NOTE — Discharge Instructions (Addendum)
 Waylan Rocher, M.D. Hand Surgery  POST-OPERATIVE DISCHARGE INSTRUCTIONS   PRESCRIPTIONS: You may have been given a prescription to be taken as directed for post-operative pain control.  You may also take over the counter ibuprofen/aleve and tylenol for pain. Take this as directed on the packaging. Do not exceed 3000 mg tylenol/acetaminophen in 24 hours.  Ibuprofen 600-800 mg (3-4) tablets by mouth every 6 hours as needed for pain.  OR Aleve 2 tablets by mouth every 12 hours (twice daily) as needed for pain.  AND/OR Tylenol 1000 mg (2 tablets) every 8 hours as needed for pain.  Please use your pain medication carefully, as refills are limited and you may not be provided with one.  As stated above, please use over the counter pain medicine - it will also be helpful with decreasing your swelling.    ANESTHESIA: After your surgery, post-surgical discomfort or pain is likely. This discomfort can last several days to a few weeks. At certain times of the day your discomfort may be more intense.   Did you receive a nerve block?  A nerve block can provide pain relief for one hour to two days after your surgery. As long as the nerve block is working, you will experience little or no sensation in the area the surgeon operated on.  As the nerve block wears off, you will begin to experience pain or discomfort. It is very important that you begin taking your prescribed pain medication before the nerve block fully wears off. Treating your pain at the first sign of the block wearing off will ensure your pain is better controlled and more tolerable when full-sensation returns. Do not wait until the pain is intolerable, as the medicine will be less effective. It is better to treat pain in advance than to try and catch up.   General Anesthesia:  If you did not receive a nerve block during your surgery, you will need to start taking your pain medication shortly after your surgery and should continue  to do so as prescribed by your surgeon.     ICE AND ELEVATION: You may use ice for the first 48-72 hours, but it is not critical.   Motion of your fingers is very important to decrease the swelling.  Elevation, as much as possible for the next 48 hours, is critical for decreasing swelling as well as for pain relief. Elevation means when you are seated or lying down, you hand should be at or above your heart. When walking, the hand needs to be at or above the level of your elbow.  If the bandage gets too tight, it may need to be loosened. Please contact our office and we will instruct you in how to do this.    SURGICAL BANDAGES:  Keep your dressing and/or splint clean and dry at all times.  Do not remove until you are seen again in the office.  If careful, you may place a plastic bag over your bandage and tape the end to shower, but be careful, do not get your bandages wet.     HAND THERAPY:  You may not need any. If you do, we will begin this at your follow up visit in the clinic.    ACTIVITY AND WORK: You are encouraged to move any fingers which are not in the bandage.  Light use of the fingers is allowed to assist the other hand with daily hygiene and eating, but strong gripping or lifting is often uncomfortable and  should be avoided.  You might miss a variable period of time from work and hopefully this issue has been discussed prior to surgery. You may not do any heavy work with your affected hand for about 2 weeks.    [facility] Second Floor, [address] [place], [place] [address] [phone]  Post Anesthesia Home Care Instructions  Activity: Get plenty of rest for the remainder of the day. A responsible individual must stay with you for 24 hours following the procedure.  For the next 24 hours, DO NOT: -Drive a car -Advertising copywriter -Drink alcoholic beverages -Take any medication unless instructed by your physician -Make any legal decisions or sign important  papers.  Meals: Start with liquid foods such as gelatin or soup. Progress to regular foods as tolerated. Avoid greasy, spicy, heavy foods. If nausea and/or vomiting occur, drink only clear liquids until the nausea and/or vomiting subsides. Call your physician if vomiting continues.  Special Instructions/Symptoms: Your throat may feel dry or sore from the anesthesia or the breathing tube placed in your throat during surgery. If this causes discomfort, gargle with warm salt water. The discomfort should disappear within 24 hours.  If you had a scopolamine patch placed behind your ear for the management of post- operative nausea and/or vomiting:  1. The medication in the patch is effective for 72 hours, after which it should be removed.  Wrap patch in a tissue and discard in the trash. Wash hands thoroughly with soap and water. 2. You may remove the patch earlier than 72 hours if you experience unpleasant side effects which may include dry mouth, dizziness or visual disturbances. 3. Avoid touching the patch. Wash your hands with soap and water after contact with the patch.       Regional Anesthesia Blocks  1. You may not be able to move or feel the "blocked" extremity after a regional anesthetic block. This may last may last from 3-48 hours after placement, but it will go away. The length of time depends on the medication injected and your individual response to the medication. As the nerves start to wake up, you may experience tingling as the movement and feeling returns to your extremity. If the numbness and inability to move your extremity has not gone away after 48 hours, please call your surgeon.   2. The extremity that is blocked will need to be protected until the numbness is gone and the strength has returned. Because you cannot feel it, you will need to take extra care to avoid injury. Because it may be weak, you may have difficulty moving it or using it. You may not know what position it  is in without looking at it while the block is in effect.  3. For blocks in the legs and feet, returning to weight bearing and walking needs to be done carefully. You will need to wait until the numbness is entirely gone and the strength has returned. You should be able to move your leg and foot normally before you try and bear weight or walk. You will need someone to be with you when you first try to ensure you do not fall and possibly risk injury.  4. Bruising and tenderness at the needle site are common side effects and will resolve in a few days.  5. Persistent numbness or new problems with movement should be communicated to the surgeon or the [facility] [phone] [facility] [phone]).

## 2023-10-14 NOTE — Op Note (Signed)
 Date of Surgery: 10/14/2023  INDICATIONS: Patient is a 73 y.o.-year-old female with a severely comminuted, intraaticular fracture of the left distal radius that was fixed 13 weeks ago with open reduction and internal fixation with volar locking plate and dorsal spanning plate.  A CT scan was obtained a few weeks ago which demonstrated healing of the fracture.  The large volar ulnar fragment appeared to be mildly radially translated but appears to have healed to the larger articular fragment.  There was significant intra-articular comminuted with some collapse at the articular surface with resulting intra-articular penetration of the screws.  Risks, benefits, and alternatives to surgery were again discussed with the patient in the preoperative area. The patient wishes to proceed with surgery.  Informed consent was signed after our discussion.   PREOPERATIVE DIAGNOSIS:  Severely comminuted, intra-articular fracture of left distal radius  POSTOPERATIVE DIAGNOSIS: Same.  PROCEDURE:  Removal of left dorsal spanning plate Removal of left volar locking plate given evidence of articular penetration of distal screws Tenolysis of EDC tendon to middle finger, ECRB and ECRL tendons   SURGEON: Auburn Blaze, M.D.  ASSIST: None  ANESTHESIA:  Regional, MAC  IV FLUIDS AND URINE: See anesthesia.  ESTIMATED BLOOD LOSS: 15 mL.  IMPLANTS: * No implants in log *   DRAINS: None  COMPLICATIONS: None noted.   DESCRIPTION OF PROCEDURE: The patient was met in the preoperative holding area where the surgical site was marked and the consent form was signed. A regional block was performed by anesthesia. The patient was brought to the operating room and remained on the stretcher.  A hand table was placed adjacent to the operative extremity and locked into place.  All bony prominences were well-padded.  Monitored anesthesia was induced.  A tourniquet was placed high on the left upper arm.  The left upper  extremity was then prepped and draped in the usual and sterile fashion.  A formal timeout was performed to confirm that this is the correct patient, surgical side, surgical site, and surgical procedure.  All necessary implants were in the room.  Preoperative antibiotics were given.  Following formal timeout the limb was gently exsanguinated with an Esmarch bandage inflated inflated to 250 mmHg.  I began by making a longitudinal incision directly over the third metacarpal shaft as well as at the proximal aspect of the forearm.  Small crossing vessels were coagulated as necessary.  A tenotomy scissor was used distally to identify the EDC tendon to the middle finger.  This was retracted ulnarly.  The plate was identified.  Scar tissue over the plate was resected.  The distal locking screws were removed.  I then turned my attention proximally.  Blunt dissection was used to identify radial wrist extensors.  These were retracted and the plate identified.  A 15 by scalpel was used to remove all scar tissue from the surface of the plate.  The 4 proximal screws were removed uneventfully.  The plate was then freed up circumferentially and pulled out of the wound distally.  The EDC to the middle finger was then freed from all adjacent scar tissue using a tenotomy scissor.  A similar procedure was performed with the ECRB and ECRL tendons.  These were also circumferentially freed up from adjacent scar tissue the wounds of the dorsal aspect of the forearm were then thoroughly irrigated with copious sterile saline.  The wounds were then closed with a 4-0 nylon suture in  horizontal mattress fashion.  I then turned my attention  to the volar aspect of the distal forearm and wrist.  Given the intra-articular penetration of the screws, and at the patient's request, I elected to proceed with removal of the volar locking plate.  Skin along the previous incision was incised.  Blunt dissection was used to identify the FCR tendon.   There was significant scar tissue surrounding the tendon.  Tenolysis was performed.  The tendon was retracted radially.  The underlying FPL tendon was also identified.  This was retracted in an ulnar direction.  The plate was identified.  There was abundant scar tissue around the plate.  This was sharply debrided using a 15 blade scalpel.  The distal locking screws were identified.  With the hook plate in place, I was not able to access the distal locking screws in the ulnar portion of the plate.  Given that the fracture appeared to be adequately healed based on the CT scan, I felt it was safe to remove the hook plate and distal locking screws.  These were removed uneventfully.  Screws in the shaft of the plate were also removed uneventfully.  The plate was elevated using a Therapist, nutritional and was removed.  Live fluoroscopic exam showed that the fracture appeared to be stable.  There did not appear to be any volar translation of the carpus with forcible, passive dorsal to volar translation as would be expected with an unstable volar ulnar fragment.  The wound was then thoroughly irrigated with copious sterile saline.  The tourniquet was deflated.  Hemostasis was achieved with direct pressure over the wound as well as with bipolar electrocautery.  The skin was then closed using a 4-0 nylon suture in horizontal mattress fashion.  The wounds were then cleaned and dressed with Xeroform, folded Kerlix, cast padding, and a well-padded volar splint was applied.  The patient was reversed from sedation.  All counts were correct x 2 at the end of the procedure.  The patient was then taken to the PACU in stable condition.   POSTOPERATIVE PLAN: She will be discharged to home with appropriate pain medications and discharge instructions.  A referral will be placed back to hand therapy.  I will see her back in 10-14 days for her first postop visit.   Auburn Blaze, MD 10:05 AM

## 2023-10-14 NOTE — Interval H&P Note (Signed)
 History and Physical Interval Note:  10/14/2023 8:25 AM  Robin Doyle  has presented today for surgery, with the diagnosis of Left distal radius fracture.  The various methods of treatment have been discussed with the patient and family. After consideration of risks, benefits and other options for treatment, the patient has consented to  Procedure(s) with comments: REMOVAL, HARDWARE (Left) TENODESIS, BICEPS (Left) - EXTENSOR TENOLYSIS as a surgical intervention.  The patient's history has been reviewed, patient examined, no change in status, stable for surgery.  I have reviewed the patient's chart and labs.  Questions were answered to the patient's satisfaction.     [name]

## 2023-10-14 NOTE — Anesthesia Preprocedure Evaluation (Addendum)
 Anesthesia Evaluation  Patient identified by MRN, date of birth, ID band Patient awake    Reviewed: Allergy & Precautions, NPO status , Patient's Chart, lab work & pertinent test results  Airway Mallampati: II  TM Distance: >3 FB Neck ROM: Full    Dental   Pulmonary neg pulmonary ROS   Pulmonary exam normal        Cardiovascular hypertension, Pt. on home beta blockers Normal cardiovascular exam+ dysrhythmias Atrial Fibrillation      Neuro/Psych negative neurological ROS     GI/Hepatic negative GI ROS, Neg liver ROS,,,  Endo/Other  negative endocrine ROS    Renal/GU negative Renal ROS     Musculoskeletal  (+) Arthritis ,    Abdominal   Peds  Hematology negative hematology ROS (+)   Anesthesia Other Findings   Reproductive/Obstetrics                             Anesthesia Physical Anesthesia Plan  ASA: 2  Anesthesia Plan: Regional and MAC   Post-op Pain Management: Regional block*   Induction:   PONV Risk Score and Plan: 2 and Propofol  infusion and Ondansetron   Airway Management Planned: Natural Airway and Simple Face Mask  Additional Equipment:   Intra-op Plan:   Post-operative Plan:   Informed Consent: I have reviewed the patients History and Physical, chart, labs and discussed the procedure including the risks, benefits and alternatives for the proposed anesthesia with the patient or authorized representative who has indicated his/her understanding and acceptance.       Plan Discussed with: CRNA  Anesthesia Plan Comments:        Anesthesia Quick Evaluation

## 2023-10-14 NOTE — Transfer of Care (Signed)
 Immediate Anesthesia Transfer of Care Note  Patient: Robin Doyle  Procedure(s) Performed: REMOVAL, HARDWARE, EXTENSOR TENOLYSIS (Left: Wrist)  Patient Location: PACU  Anesthesia Type:MAC and Regional  Level of Consciousness: awake, alert , oriented, and patient cooperative  Airway & Oxygen Therapy: Patient Spontanous Breathing  Post-op Assessment: Report given to RN and Post -op Vital signs reviewed and stable  Post vital signs: Reviewed and stable  Last Vitals:  Vitals Value Taken Time  BP 113/87 10/14/23 0956  Temp    Pulse 61 10/14/23 0958  Resp 12 10/14/23 0958  SpO2 98 % 10/14/23 0958  Vitals shown include unfiled device data.  Last Pain:  Vitals:   10/14/23 0746  PainSc: 0-No pain      Patients Stated Pain Goal: 4 (10/14/23 0719)  Complications: No notable events documented.

## 2023-10-15 ENCOUNTER — Encounter (HOSPITAL_BASED_OUTPATIENT_CLINIC_OR_DEPARTMENT_OTHER): Payer: Self-pay | Admitting: Orthopedic Surgery

## 2023-10-26 DIAGNOSIS — S52502D Unspecified fracture of the lower end of left radius, subsequent encounter for closed fracture with routine healing: Secondary | ICD-10-CM | POA: Diagnosis not present

## 2023-10-26 DIAGNOSIS — M25632 Stiffness of left wrist, not elsewhere classified: Secondary | ICD-10-CM | POA: Diagnosis not present

## 2023-11-23 DIAGNOSIS — S52502A Unspecified fracture of the lower end of left radius, initial encounter for closed fracture: Secondary | ICD-10-CM | POA: Diagnosis not present

## 2023-12-17 DIAGNOSIS — M25632 Stiffness of left wrist, not elsewhere classified: Secondary | ICD-10-CM | POA: Diagnosis not present

## 2023-12-23 DIAGNOSIS — N952 Postmenopausal atrophic vaginitis: Secondary | ICD-10-CM | POA: Diagnosis not present

## 2023-12-23 DIAGNOSIS — R3911 Hesitancy of micturition: Secondary | ICD-10-CM | POA: Diagnosis not present

## 2023-12-23 DIAGNOSIS — R0982 Postnasal drip: Secondary | ICD-10-CM | POA: Diagnosis not present

## 2024-01-04 DIAGNOSIS — S52502D Unspecified fracture of the lower end of left radius, subsequent encounter for closed fracture with routine healing: Secondary | ICD-10-CM | POA: Diagnosis not present

## 2024-01-04 DIAGNOSIS — M25632 Stiffness of left wrist, not elsewhere classified: Secondary | ICD-10-CM | POA: Diagnosis not present

## 2024-02-11 DIAGNOSIS — M25632 Stiffness of left wrist, not elsewhere classified: Secondary | ICD-10-CM | POA: Diagnosis not present

## 2024-02-25 DIAGNOSIS — M25632 Stiffness of left wrist, not elsewhere classified: Secondary | ICD-10-CM | POA: Diagnosis not present

## 2024-03-09 DIAGNOSIS — Z0000 Encounter for general adult medical examination without abnormal findings: Secondary | ICD-10-CM | POA: Diagnosis not present

## 2024-03-09 DIAGNOSIS — Z9181 History of falling: Secondary | ICD-10-CM | POA: Diagnosis not present

## 2024-03-10 DIAGNOSIS — Z1211 Encounter for screening for malignant neoplasm of colon: Secondary | ICD-10-CM | POA: Diagnosis not present

## 2024-03-10 DIAGNOSIS — K582 Mixed irritable bowel syndrome: Secondary | ICD-10-CM | POA: Diagnosis not present

## 2024-03-10 DIAGNOSIS — R1032 Left lower quadrant pain: Secondary | ICD-10-CM | POA: Diagnosis not present

## 2024-03-10 DIAGNOSIS — N393 Stress incontinence (female) (male): Secondary | ICD-10-CM | POA: Diagnosis not present

## 2024-03-14 DIAGNOSIS — M5459 Other low back pain: Secondary | ICD-10-CM | POA: Diagnosis not present

## 2024-03-14 DIAGNOSIS — M25632 Stiffness of left wrist, not elsewhere classified: Secondary | ICD-10-CM | POA: Diagnosis not present

## 2024-03-14 DIAGNOSIS — M51379 Other intervertebral disc degeneration, lumbosacral region without mention of lumbar back pain or lower extremity pain: Secondary | ICD-10-CM | POA: Insufficient documentation

## 2024-03-14 DIAGNOSIS — M51372 Other intervertebral disc degeneration, lumbosacral region with discogenic back pain and lower extremity pain: Secondary | ICD-10-CM | POA: Diagnosis not present

## 2024-03-14 DIAGNOSIS — M419 Scoliosis, unspecified: Secondary | ICD-10-CM | POA: Diagnosis not present

## 2024-03-22 DIAGNOSIS — M6281 Muscle weakness (generalized): Secondary | ICD-10-CM | POA: Diagnosis not present

## 2024-03-22 DIAGNOSIS — M545 Low back pain, unspecified: Secondary | ICD-10-CM | POA: Diagnosis not present

## 2024-03-23 DIAGNOSIS — Z1211 Encounter for screening for malignant neoplasm of colon: Secondary | ICD-10-CM | POA: Diagnosis not present

## 2024-03-28 DIAGNOSIS — M6281 Muscle weakness (generalized): Secondary | ICD-10-CM | POA: Diagnosis not present

## 2024-03-28 DIAGNOSIS — M545 Low back pain, unspecified: Secondary | ICD-10-CM | POA: Diagnosis not present

## 2024-03-29 LAB — COLOGUARD: COLOGUARD: POSITIVE — AB

## 2024-03-30 DIAGNOSIS — M25632 Stiffness of left wrist, not elsewhere classified: Secondary | ICD-10-CM | POA: Diagnosis not present

## 2024-04-04 DIAGNOSIS — M545 Low back pain, unspecified: Secondary | ICD-10-CM | POA: Diagnosis not present

## 2024-04-04 DIAGNOSIS — M6281 Muscle weakness (generalized): Secondary | ICD-10-CM | POA: Diagnosis not present

## 2024-04-11 DIAGNOSIS — M6281 Muscle weakness (generalized): Secondary | ICD-10-CM | POA: Diagnosis not present

## 2024-04-11 DIAGNOSIS — M545 Low back pain, unspecified: Secondary | ICD-10-CM | POA: Diagnosis not present

## 2024-04-13 DIAGNOSIS — R1032 Left lower quadrant pain: Secondary | ICD-10-CM | POA: Diagnosis not present

## 2024-04-13 DIAGNOSIS — R195 Other fecal abnormalities: Secondary | ICD-10-CM | POA: Diagnosis not present

## 2024-04-18 DIAGNOSIS — M6281 Muscle weakness (generalized): Secondary | ICD-10-CM | POA: Diagnosis not present

## 2024-04-18 DIAGNOSIS — M545 Low back pain, unspecified: Secondary | ICD-10-CM | POA: Diagnosis not present

## 2024-04-19 DIAGNOSIS — Z1231 Encounter for screening mammogram for malignant neoplasm of breast: Secondary | ICD-10-CM | POA: Diagnosis not present

## 2024-04-19 NOTE — Progress Notes (Unsigned)
 Cardiology Office Note:    Date:  04/20/2024   ID:  Robin Doyle, DOB 1951-06-06, MRN [id number]  PCP:  Keren Vicenta BRAVO, MD  Cardiologist:  Redell Leiter, MD    Referring MD: Keren Vicenta BRAVO, MD    ASSESSMENT:    1. Chest pain of uncertain etiology   2. Paroxysmal atrial fibrillation (HCC)    PLAN:    In order of problems listed above:  Clinically she has coronary artery spasm was relieved with her husband's nitroglycerin I offered her prescription she said she would use yes.  If frequent episodes we need to do further evaluation cardiac CTA and treatment including either oral mononitrate or calcium channel blocker Stable no clinical recurrence and is not anticoagulated   Next appointment: I will plan to see in 1 year or sooner   Medication Adjustments/Labs and Tests Ordered: Current medicines are reviewed at length with the patient today.  Concerns regarding medicines are outlined above.  Orders Placed This Encounter  Procedures   EKG 12-Lead   Meds ordered this encounter  Medications   nitroGLYCERIN (NITROSTAT) 0.4 MG SL tablet    Sig: Place 1 tablet (0.4 mg total) under the tongue every 5 (five) minutes as needed for chest pain.    Dispense:  25 tablet    Refill:  1     History of Present Illness:    Robin Doyle is a 73 y.o. female last seen 08/31/2023 atrial arrhythmia and symptomatic APCs and paroxysmal atrial fibrillation. Background history of chest pain was seen to be typical angina and a coronary calcium score of 0 She had an episode recently in the evening quite bothersome took her husband's nitroglycerin with relief Has a mobile Kardia device no documented atrial fibrillation and certainly has done much better since she no longer cares for her mother at home recently passed Compliance with diet, lifestyle and medications: Yes Past Medical History:  Diagnosis Date   Acquired bilateral hammer toes 12/11/2021   Acquired plantar porokeratosis  12/11/2021   Allergy    Arrhythmia 02/05/2017   Arthritis    low back, posterior cervical, hips, shoulder   Back pain 02/05/2017   Bilateral bunions 12/11/2021   Colon polyps    hyperplastic, last study  May '09   Dyslipidemia 12/11/2010   Baseline LDL 160 June '12     Ectopic atrial tachycardia 06/24/2016   Heart palpitations    High risk medication use 02/08/2017   FLECANIDE     History of adenomatous polyp of colon 04/28/2018   Hyperlipidemia LDL goal < 130 12/11/2010   Baseline LDL 160 June '12    Internal hemorrhoids 04/28/2018   Paroxysmal atrial fibrillation (HCC) 08/12/2022   Premature atrial contractions 06/24/2016   Routine general medical examination at a health care facility 12/11/2010   SUI (stress urinary incontinence, female) 02/27/2022    Current Medications: Current Meds  Medication Sig   Biotin 1000 MCG tablet Take 1,000 mcg by mouth daily.   Cyanocobalamin (VITAMIN B 12 PO) Take 1,000 mcg by mouth daily.   estradiol  (ESTRACE ) 0.01 % CREA vaginal cream Place 1 Applicatorful vaginally 3 (three) times a week.   Flaxseed, Linseed, (FLAXSEED OIL) 1000 MG CAPS Take 1,000 mg by mouth daily.   Magnesium 250 MG TABS Take 1 tablet by mouth daily.   metoprolol  tartrate (LOPRESSOR ) 25 MG tablet Take 1 tablet (25 mg) daily as needed for heart rate greater than 130.   Multiple Vitamins-Minerals (ALIVE WOMENS 50+ PO) Take 1 tablet  by mouth daily.   nitroGLYCERIN (NITROSTAT) 0.4 MG SL tablet Place 1 tablet (0.4 mg total) under the tongue every 5 (five) minutes as needed for chest pain.   Omega-3 Fatty Acids (FISH OIL) 1000 MG CAPS Take 1,000 mg by mouth daily.   Quercetin 250 MG TABS Take 250 mg by mouth daily at 12 noon.   TURMERIC PO Take 250 mg by mouth daily.      EKGs/Labs/Other Studies Reviewed:    The following studies were reviewed today:  Cardiac Studies & Procedures    ______________________________________________________________________________________________     ECHOCARDIOGRAM  ECHOCARDIOGRAM COMPLETE 11/24/2019  Narrative ECHOCARDIOGRAM REPORT    Patient Name:   Robin Doyle Date of Exam: 11/24/2019 Medical Rec #:  [id number]     Height:       66.5 in Accession #:    [id number]    Weight:       135.0 lb Date of Birth:  Feb 02, 1951      BSA:          1.702 m Patient Age:    68 years      BP:           108/54 mmHg Patient Gender: F             HR:           72 bpm. 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 Risk Factors:Dyslipidemia. Atrial tachycardia.  Sonographer:    Annabella Fell RDCS Referring Phys: [id number] Jewelz Ricklefs J Shamica Moree  IMPRESSIONS   1. Left ventricular ejection fraction, by estimation, is 60 to 65%. The left ventricle has normal function. The left ventricle has no regional wall motion abnormalities. Left ventricular diastolic parameters were normal. 2. Right ventricular systolic function is normal. The right ventricular size is normal. 3. The mitral valve is normal in structure. No evidence of mitral valve regurgitation. No evidence of mitral stenosis. 4. The aortic valve is normal in structure. Aortic valve regurgitation is not visualized. Mild to moderate aortic valve sclerosis/calcification is present, without any evidence of aortic stenosis.  FINDINGS Left Ventricle: Left ventricular ejection fraction, by estimation, is 60 to 65%. The left ventricle has normal function. The left ventricle has no regional wall motion abnormalities. The left ventricular internal cavity size was normal in size. There is no left ventricular hypertrophy. Left ventricular diastolic parameters were normal.  Right Ventricle: The right ventricular size is normal. No increase in right ventricular wall  thickness. Right ventricular systolic function is normal.  Left Atrium: Left atrial size was normal in size.  Right Atrium: Right atrial size was normal in size.  Pericardium: There is no evidence of pericardial effusion.  Mitral Valve: The mitral valve is normal in structure. Normal mobility of the mitral valve leaflets. No evidence of mitral valve regurgitation. No evidence of mitral valve stenosis.  Tricuspid Valve: The tricuspid valve is normal in structure. Tricuspid valve regurgitation is trivial. No evidence of tricuspid stenosis.  Aortic Valve: The aortic valve is normal in structure.. There is mild thickening of the aortic valve. Aortic valve regurgitation is not visualized. Mild to moderate aortic valve sclerosis/calcification is present, without any evidence of aortic stenosis. There is mild thickening of the aortic valve.  Pulmonic Valve: The pulmonic valve was normal in structure. Pulmonic valve regurgitation is not visualized. No evidence of  pulmonic stenosis.  Aorta: The aortic root is normal in size and structure.  Venous: The inferior vena cava is normal in size with greater than 50% respiratory variability, suggesting right atrial pressure of 3 mmHg.  IAS/Shunts: No atrial level shunt detected by color flow Doppler.   LEFT VENTRICLE PLAX 2D LVIDd:         4.15 cm  Diastology LVIDs:         2.66 cm  LV e' lateral:   10.10 cm/s LV PW:         0.92 cm  LV E/e' lateral: 9.4 LV IVS:        0.74 cm  LV e' medial:    7.51 cm/s LVOT diam:     1.90 cm  LV E/e' medial:  12.6 LV SV:         51 LV SV Index:   30 LVOT Area:     2.84 cm   RIGHT VENTRICLE RV S prime:     11.70 cm/s TAPSE (M-mode): 2.0 cm  LEFT ATRIUM             Index       RIGHT ATRIUM           Index LA diam:        3.10 cm 1.82 cm/m  RA Area:     11.20 cm LA Vol (A2C):   33.9 ml 19.92 ml/m RA Volume:   26.00 ml  15.28 ml/m LA Vol (A4C):   39.4 ml 23.15 ml/m LA Biplane Vol: 37.0 ml 21.74  ml/m AORTIC VALVE LVOT Vmax:   85.70 cm/s LVOT Vmean:  53.600 cm/s LVOT VTI:    0.181 m  AORTA Ao Root diam: 2.40 cm Ao Asc diam:  2.70 cm  MITRAL VALVE MV Area (PHT): 6.17 cm    SHUNTS MV Decel Time: 123 msec    Systemic VTI:  0.18 m MV E velocity: 94.60 cm/s  Systemic Diam: 1.90 cm MV A velocity: 53.40 cm/s MV E/A ratio:  1.77  Lamar Fitch MD Electronically signed by Lamar Fitch MD Signature Date/Time: 11/24/2019/12:59:14 PM    Final    MONITORS  LONG TERM MONITOR (3-14 DAYS) 09/05/2022  Narrative NSR with sinus brady and sinus tachycardia Brief episodes of NS SVT, likely atrial tachycardia Occaisional PAC's and PVC's No prolonged pauses  Gregg Taylor,MD  Patch Wear Time:  13 days and 11 hours (2024-02-22T22:56:09-498 to 2024-03-07T10:54:40-0500)  Patient had a min HR of 49 bpm, max HR of 182 bpm, and avg HR of 78 bpm. Predominant underlying rhythm was Sinus Rhythm. 29 Supraventricular Tachycardia runs occurred, the run with the fastest interval lasting 5 beats with a max rate of 182 bpm, the longest lasting 14 beats with an avg rate of 86 bpm. Some episodes of Supraventricular Tachycardia may be possible Atrial Tachycardia with variable block. Isolated SVEs were frequent (13.7%, [id number]), SVE Couplets were rare (<1.0%, 4178), and SVE Triplets were rare (<1.0%, 1795). Isolated VEs were rare (<1.0%), and no VE Couplets or VE Triplets were present.       ______________________________________________________________________________________________      EKG Interpretation Date/Time:  Wednesday April 20 2024 13:47:20 EDT Ventricular Rate:  65 PR Interval:  168 QRS Duration:  80 QT Interval:  408 QTC Calculation: 424 R Axis:   91  Text Interpretation: Sinus rhythm with Premature atrial complexes Rightward axis When compared with ECG of 08-Oct-2023 13:19, Premature atrial complexes are now Present Confirmed by Monetta Rogue ([id number]) on 04/20/2024  1:59:31 PM   Recent Labs: 08/31/2023: BUN 17; Creatinine, Ser 1.06; Magnesium 2.1; Potassium 4.0; Sodium 145  Recent Lipid Panel    Component Value Date/Time   CHOL 218 (H) 12/10/2010 0802   TRIG 51.0 12/10/2010 0802   HDL 44.80 12/10/2010 0802   CHOLHDL 5 12/10/2010 0802   VLDL 10.2 12/10/2010 0802   LDLDIRECT 160.1 12/10/2010 0802    Physical Exam:    VS:  BP (!) 142/76   Pulse 62   Ht 5' 6 (1.676 m)   Wt 131 lb 12.8 oz (59.8 kg)   SpO2 96%   BMI 21.27 kg/m     Wt Readings from Last 3 Encounters:  04/20/24 131 lb 12.8 oz (59.8 kg)  10/14/23 127 lb 6.8 oz (57.8 kg)  08/31/23 128 lb 3.2 oz (58.2 kg)     GEN:  Well nourished, well developed in no acute distress HEENT: Normal NECK: No JVD; No carotid bruits LYMPHATICS: No lymphadenopathy CARDIAC: RRR, no murmurs, rubs, gallops RESPIRATORY:  Clear to auscultation without rales, wheezing or rhonchi  ABDOMEN: Soft, non-tender, non-distended MUSCULOSKELETAL:  No edema; No deformity  SKIN: Warm and dry NEUROLOGIC:  Alert and oriented x 3 PSYCHIATRIC:  Normal affect    Signed, Redell Leiter, MD  04/20/2024 3:04 PM    [facility]

## 2024-04-20 ENCOUNTER — Encounter: Payer: Self-pay | Admitting: Cardiology

## 2024-04-20 ENCOUNTER — Ambulatory Visit: Attending: Cardiology | Admitting: Cardiology

## 2024-04-20 VITALS — BP 142/76 | HR 62 | Ht 66.0 in | Wt 131.8 lb

## 2024-04-20 DIAGNOSIS — I480 Paroxysmal atrial fibrillation: Secondary | ICD-10-CM | POA: Diagnosis not present

## 2024-04-20 DIAGNOSIS — R079 Chest pain, unspecified: Secondary | ICD-10-CM | POA: Insufficient documentation

## 2024-04-20 DIAGNOSIS — E039 Hypothyroidism, unspecified: Secondary | ICD-10-CM | POA: Insufficient documentation

## 2024-04-20 MED ORDER — NITROGLYCERIN 0.4 MG SL SUBL: 0.4000 mg | SUBLINGUAL_TABLET | SUBLINGUAL | 1 refills | Status: AC | PRN

## 2024-04-20 NOTE — Patient Instructions (Signed)
 Medication Instructions:  Your physician has recommended you make the following change in your medication:   START: Nitroglycerin 0.4 mg under the tongue every 5 minutes x 3 doses as needed for chest pain  *If you need a refill on your cardiac medications before your next appointment, please call your pharmacy*  Lab Work: None If you have labs (blood work) drawn today and your tests are completely normal, you will receive your results only by: MyChart Message (if you have MyChart) OR A paper copy in the mail If you have any lab test that is abnormal or we need to change your treatment, we will call you to review the results.  Testing/Procedures: None  Follow-Up: At [facility], you and your health needs are our priority.  As part of our continuing mission to provide you with exceptional heart care, our providers are all part of one team.  This team includes your primary Cardiologist (physician) and Advanced Practice Providers or APPs (Physician Assistants and Nurse Practitioners) who all work together to provide you with the care you need, when you need it.  Your next appointment:   1 year(s)  Provider:   Redell Leiter, MD    We recommend signing up for the patient portal called MyChart.  Sign up information is provided on this After Visit Summary.  MyChart is used to connect with patients for Virtual Visits (Telemedicine).  Patients are able to view lab/test results, encounter notes, upcoming appointments, etc.  Non-urgent messages can be sent to your provider as well.   To learn more about what you can do with MyChart, go to [email or web address].   Other Instructions None

## 2024-04-27 DIAGNOSIS — R1310 Dysphagia, unspecified: Secondary | ICD-10-CM | POA: Diagnosis not present

## 2024-04-27 DIAGNOSIS — Z1211 Encounter for screening for malignant neoplasm of colon: Secondary | ICD-10-CM | POA: Diagnosis not present

## 2024-04-27 DIAGNOSIS — R1032 Left lower quadrant pain: Secondary | ICD-10-CM | POA: Diagnosis not present

## 2024-05-02 DIAGNOSIS — M6281 Muscle weakness (generalized): Secondary | ICD-10-CM | POA: Diagnosis not present

## 2024-05-02 DIAGNOSIS — M545 Low back pain, unspecified: Secondary | ICD-10-CM | POA: Diagnosis not present

## 2024-05-09 DIAGNOSIS — M6281 Muscle weakness (generalized): Secondary | ICD-10-CM | POA: Diagnosis not present

## 2024-05-09 DIAGNOSIS — M545 Low back pain, unspecified: Secondary | ICD-10-CM | POA: Diagnosis not present

## 2024-05-16 DIAGNOSIS — M6281 Muscle weakness (generalized): Secondary | ICD-10-CM | POA: Diagnosis not present

## 2024-05-16 DIAGNOSIS — M545 Low back pain, unspecified: Secondary | ICD-10-CM | POA: Diagnosis not present

## 2024-05-18 DIAGNOSIS — R059 Cough, unspecified: Secondary | ICD-10-CM | POA: Diagnosis not present

## 2024-05-18 DIAGNOSIS — K219 Gastro-esophageal reflux disease without esophagitis: Secondary | ICD-10-CM | POA: Diagnosis not present

## 2024-05-18 DIAGNOSIS — R195 Other fecal abnormalities: Secondary | ICD-10-CM | POA: Diagnosis not present

## 2024-05-23 ENCOUNTER — Telehealth (HOSPITAL_BASED_OUTPATIENT_CLINIC_OR_DEPARTMENT_OTHER): Payer: Self-pay

## 2024-05-23 NOTE — Telephone Encounter (Signed)
 Dr. Monetta,  You saw this patient on 04/20/2024. Per protocol we request that you comment on his cardiac risk to proceed with colonoscopy since it has been less than 2 months since evaluated in the office. Please send your comment to P CV Pre-Op Pool.  Thank you, Lamarr Satterfield DNP, ANP, AACC.

## 2024-05-23 NOTE — Telephone Encounter (Signed)
   Pre-operative Risk Assessment    Patient Name: Robin Doyle  DOB: [birth year] MRN: [id number]   Date of last office visit: 04/20/24 with Dr. Monetta Date of next office visit: NA  Request for Surgical Clearance    Procedure:  Colonoscopy  Date of Surgery:  Clearance 05/26/24                                 Surgeon:  Dr. Rollin Socks Group or Practice Name:  [facility] Phone number:  [phone] Fax number:  [phone]   Type of Clearance Requested:   - Medical    Type of Anesthesia:  propofol    Additional requests/questions:    [name]   05/23/2024, 11:00 AM

## 2024-05-24 NOTE — Telephone Encounter (Signed)
   Patient Name: Robin Doyle  DOB: 1950-07-21 MRN: [id number]  Primary Cardiologist: Redell Leiter, MD  Chart reviewed as part of pre-operative protocol coverage. Given past medical history and time since last visit, based on ACC/AHA guidelines, Robin Doyle is at acceptable risk for the planned procedure without further cardiovascular testing.   Per Dr. Leiter, who saw the patient most recently on 04/20/2024, She has had both a coronary calcium score of 0 and normal coronary arteriography in 2023 2 years ago and certainly is optimized for her planned procedure.   I will route this recommendation to the requesting party via Epic fax function and remove from pre-op pool.  Please call with questions.  Damien JAYSON Braver, NP 05/24/2024, 12:16 PM

## 2024-05-25 DIAGNOSIS — M545 Low back pain, unspecified: Secondary | ICD-10-CM | POA: Diagnosis not present

## 2024-05-25 DIAGNOSIS — M6281 Muscle weakness (generalized): Secondary | ICD-10-CM | POA: Diagnosis not present

## 2024-05-26 DIAGNOSIS — K635 Polyp of colon: Secondary | ICD-10-CM | POA: Diagnosis not present

## 2024-05-26 DIAGNOSIS — K5730 Diverticulosis of large intestine without perforation or abscess without bleeding: Secondary | ICD-10-CM | POA: Diagnosis not present

## 2024-05-26 DIAGNOSIS — K2950 Unspecified chronic gastritis without bleeding: Secondary | ICD-10-CM | POA: Diagnosis not present

## 2024-05-26 DIAGNOSIS — Z1211 Encounter for screening for malignant neoplasm of colon: Secondary | ICD-10-CM | POA: Diagnosis not present

## 2024-05-26 DIAGNOSIS — R195 Other fecal abnormalities: Secondary | ICD-10-CM | POA: Diagnosis not present

## 2024-05-30 DIAGNOSIS — M545 Low back pain, unspecified: Secondary | ICD-10-CM | POA: Diagnosis not present

## 2024-05-30 DIAGNOSIS — M6281 Muscle weakness (generalized): Secondary | ICD-10-CM | POA: Diagnosis not present

## 2024-06-07 DIAGNOSIS — M545 Low back pain, unspecified: Secondary | ICD-10-CM | POA: Diagnosis not present

## 2024-06-07 DIAGNOSIS — M6281 Muscle weakness (generalized): Secondary | ICD-10-CM | POA: Diagnosis not present

## 2024-06-10 ENCOUNTER — Ambulatory Visit: Admitting: Cardiology
